# Patient Record
Sex: Female | Born: 1985 | Race: White | Hispanic: No | Marital: Married | Smoking: Current every day smoker
Health system: Southern US, Community
[De-identification: ages and names within clinical notes are randomized; demographics above are authoritative.]

## PROBLEM LIST (undated history)

## (undated) ENCOUNTER — Inpatient Hospital Stay (HOSPITAL_COMMUNITY): Payer: Self-pay

## (undated) DIAGNOSIS — O26839 Pregnancy related renal disease, unspecified trimester: Secondary | ICD-10-CM

## (undated) DIAGNOSIS — F909 Attention-deficit hyperactivity disorder, unspecified type: Secondary | ICD-10-CM

## (undated) DIAGNOSIS — F419 Anxiety disorder, unspecified: Secondary | ICD-10-CM

## (undated) DIAGNOSIS — N390 Urinary tract infection, site not specified: Secondary | ICD-10-CM

## (undated) DIAGNOSIS — F329 Major depressive disorder, single episode, unspecified: Secondary | ICD-10-CM

## (undated) DIAGNOSIS — F32A Depression, unspecified: Secondary | ICD-10-CM

## (undated) DIAGNOSIS — N200 Calculus of kidney: Secondary | ICD-10-CM

## (undated) HISTORY — DX: Major depressive disorder, single episode, unspecified: F32.9

## (undated) HISTORY — DX: Urinary tract infection, site not specified: N39.0

## (undated) HISTORY — DX: Attention-deficit hyperactivity disorder, unspecified type: F90.9

## (undated) HISTORY — DX: Pregnancy related renal disease, unspecified trimester: O26.839

## (undated) HISTORY — DX: Calculus of kidney: N20.0

## (undated) HISTORY — DX: Depression, unspecified: F32.A

## (undated) HISTORY — PX: BREAST BIOPSY: SHX20

## (undated) HISTORY — DX: Anxiety disorder, unspecified: F41.9

---

## 2009-05-17 ENCOUNTER — Encounter: Payer: Self-pay | Admitting: Internal Medicine

## 2009-06-12 ENCOUNTER — Encounter: Payer: Self-pay | Admitting: Internal Medicine

## 2009-06-15 ENCOUNTER — Encounter: Payer: Self-pay | Admitting: Internal Medicine

## 2009-10-02 ENCOUNTER — Ambulatory Visit: Payer: Self-pay | Admitting: Internal Medicine

## 2009-10-02 DIAGNOSIS — E282 Polycystic ovarian syndrome: Secondary | ICD-10-CM | POA: Insufficient documentation

## 2009-10-02 DIAGNOSIS — F329 Major depressive disorder, single episode, unspecified: Secondary | ICD-10-CM

## 2009-10-02 DIAGNOSIS — N390 Urinary tract infection, site not specified: Secondary | ICD-10-CM

## 2009-10-02 DIAGNOSIS — F32A Depression, unspecified: Secondary | ICD-10-CM | POA: Insufficient documentation

## 2009-10-02 DIAGNOSIS — F988 Other specified behavioral and emotional disorders with onset usually occurring in childhood and adolescence: Secondary | ICD-10-CM

## 2009-10-23 ENCOUNTER — Ambulatory Visit (HOSPITAL_COMMUNITY): Admission: RE | Admit: 2009-10-23 | Discharge: 2009-10-23 | Payer: Self-pay | Admitting: Internal Medicine

## 2009-10-31 ENCOUNTER — Telehealth: Payer: Self-pay | Admitting: Internal Medicine

## 2009-11-16 ENCOUNTER — Ambulatory Visit: Payer: Self-pay | Admitting: Internal Medicine

## 2010-05-17 ENCOUNTER — Ambulatory Visit: Payer: Self-pay | Admitting: Internal Medicine

## 2010-05-17 DIAGNOSIS — L719 Rosacea, unspecified: Secondary | ICD-10-CM | POA: Insufficient documentation

## 2010-06-18 ENCOUNTER — Telehealth: Payer: Self-pay | Admitting: Internal Medicine

## 2010-06-20 ENCOUNTER — Encounter: Payer: Self-pay | Admitting: Internal Medicine

## 2010-07-26 ENCOUNTER — Ambulatory Visit
Admission: RE | Admit: 2010-07-26 | Discharge: 2010-07-26 | Payer: Self-pay | Source: Home / Self Care | Attending: Internal Medicine | Admitting: Internal Medicine

## 2010-08-06 NOTE — Assessment & Plan Note (Signed)
Summary: NEW TO [facility] PER DR//CCM   Vital Signs:  Patient profile:   25 year old female Height:      69.5 inches Weight:      222 pounds BMI:     32.43 Temp:     98.4 degrees F oral Pulse rate:   80 / minute Resp:     14 per minute BP sitting:   130 / 80  (left arm)  Vitals Entered By: Willy Eddy, LPN (October 02, 2009 2:52 PM) CC: new pt to establish   CC:  new pt to establish.  History of Present Illness: 52 you WF  with regular menses and flow who present with weight issues and ache and has seen an endocrinologist The work up was for adrenal and ovarian issues and the workup was non revealing CT and Labs breast cysts with aspiration and then removal reviewd all labs I have spent greater that 30 min face to face evaluating this patient   Preventive Screening-Counseling & Management  Alcohol-Tobacco     Smoking Status: current     Packs/Day: 0.5     Year Started: 2005  Caffeine-Diet-Exercise     Does Patient Exercise: no      Drug Use:  no.    Problems Prior to Update: None  Medications Prior to Update: 1)  None  Current Medications (verified): 1)  Adderall Xr 30 Mg Xr24h-Cap (Amphetamine-Dextroamphetamine) .Marland Kitchen.. 1 Once Daily 2)  Sertraline Hcl 100 Mg Tabs (Sertraline Hcl) .Marland Kitchen.. 1 Once Daily 3)  Septra Ds 800-160 Mg Tabs (Sulfamethoxazole-Trimethoprim) .Marland Kitchen.. 1 Two Times A Day (Derm Ordered) 4)  Metformin Hcl 500 Mg Xr24h-Tab (Metformin Hcl) .... One By Mouth Daily  Allergies (verified): 1)  ! Ceclor 2)  ! * Pediazole 3)  ! Augmentin (Amoxicillin-Pot Clavulanate)  Past History:  Family History: Last updated: 10/02/2009 Family History High cholesterol  Social History: Last updated: 10/02/2009 Occupation:part time student and Company secretary Single Current Smoker Alcohol use-yes Drug use-no Regular exercise-no  Risk Factors: Exercise: no (10/02/2009)  Risk Factors: Smoking Status: current (10/02/2009) Packs/Day: 0.5 (10/02/2009)  Past  medical, surgical, family and social histories (including risk factors) reviewed for relevance to current acute and chronic problems.  Past Medical History: Depression neonatal jaundice uti  Past Surgical History: breast bc 2010  Family History: Reviewed history and no changes required. Family History High cholesterol  Social History: Reviewed history and no changes required. Occupation:part time Theatre stage manager Single Current Smoker Alcohol use-yes Drug use-no Regular exercise-no Smoking Status:  current Packs/Day:  0.5 Drug Use:  no Does Patient Exercise:  no  Review of Systems  The patient denies anorexia, fever, weight loss, weight gain, vision loss, decreased hearing, hoarseness, chest pain, syncope, dyspnea on exertion, peripheral edema, prolonged cough, headaches, hemoptysis, abdominal pain, melena, hematochezia, severe indigestion/heartburn, hematuria, incontinence, genital sores, muscle weakness, suspicious skin lesions, transient blindness, difficulty walking, depression, unusual weight change, abnormal bleeding, enlarged lymph nodes, angioedema, and breast masses.    Physical Exam  General:  Well-developed,well-nourished,in no acute distress; alert,appropriate and cooperative throughout examination Head:  Normocephalic and atraumatic without obvious abnormalities. No apparent alopecia or balding. Eyes:  No corneal or conjunctival inflammation noted. EOMI. Perrla. Funduscopic exam benign, without hemorrhages, exudates or papilledema. Vision grossly normal. Ears:  R ear normal and L ear normal.   Nose:  no external deformity and no nasal discharge.   Mouth:  good dentition and pharynx pink and moist.   Lungs:  normal respiratory effort and  no wheezes.   Heart:  normal rate and regular rhythm.   Abdomen:  soft, non-tender, and normal bowel sounds.   Skin:  no ecchymoses, no petechiae, no purpura, and acneiform lesions.   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 1 once daily  Discussed treatment options, including trial of antidpressant medication. Will refer to behavioral health. Follow-up call in in 24-48 hours and recheck in 2 weeks, sooner as needed. Patient agrees to call if any worsening of symptoms or thoughts of doing harm arise. Verified that the patient has no suicidal ideation at this time.   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 1 once daily 2)  Sertraline Hcl 100 Mg Tabs (Sertraline hcl) .Marland Kitchen.. 1 once daily 3)  Septra Ds 800-160 Mg Tabs (Sulfamethoxazole-trimethoprim) .Marland Kitchen.. 1 two times a day (derm ordered) 4)  Metformin Hcl 500 Mg Xr24h-tab (Metformin hcl) .... One by mouth daily  Patient Instructions: 1)  Please schedule a follow-up appointment in 2 months. Prescriptions: METFORMIN HCL 500 MG XR24H-TAB (METFORMIN HCL) one by mouth daily  #30 x 3   Entered and Authorized by:   Stacie Glaze MD   Signed by:   Stacie Glaze MD on 10/02/2009   Method used:   Electronically to        [facility] # [phone]* (retail)       [address]       [place], [place]  [address]       Ph: [phone]       Fax: [phone]   RxID:   [id number]    Preventive Care Screening  Pap Smear:    Date:   02/03/2009    Next Due:  02/2010    Results:  normal   Last Tetanus Booster:    Date:  08/07/2008    Results:  Td

## 2010-08-06 NOTE — Progress Notes (Signed)
Summary: lab  Phone Note Call from Patient Call back at [phone]   Reason for Call: Acute Illness Summary of Call: Results of labs done at [facility]. Initial call taken by: Rudy Jew, RN,  October 31, 2009 8:28 AM  Follow-up for Phone Call        left message on machine wnl and no polycystic ovairies Follow-up by: Willy Eddy, LPN,  October 31, 2009 9:53 AM

## 2010-08-06 NOTE — Letter (Signed)
Summary: [facility]   Imported By: Maryln Gottron 10/05/2009 09:09:19  _____________________________________________________________________  External Attachment:    Type:   Image     Comment:   External Document

## 2010-08-06 NOTE — Assessment & Plan Note (Signed)
Summary: 2 month rov/njr   Vital Signs:  Patient profile:   25 year old female Height:      69.5 inches Weight:      222 pounds BMI:     32.43 Temp:     98.2 degrees F oral Pulse rate:   76 / minute Resp:     14 per minute BP sitting:   110 / 70  (left arm)  Vitals Entered By: Willy Eddy, LPN (Nov 16, 2009 3:45 PM) CC: roa   CC:  roa.  History of Present Illness: the metformin had helped the length and the cramps of the menstraul cycle  Preventive Screening-Counseling & Management  Alcohol-Tobacco     Smoking Status: current     Packs/Day: 0.5     Year Started: 2005  Problems Prior to Update: 1)  Attention Deficit Disorder, Adult  (ICD-314.00) 2)  Stein-leventhal Syndrome  (ICD-256.4) 3)  Uti  (ICD-599.0) 4)  Depression  (ICD-311)  Current Problems (verified): 1)  Attention Deficit Disorder, Adult  (ICD-314.00) 2)  Stein-leventhal Syndrome  (ICD-256.4) 3)  Uti  (ICD-599.0) 4)  Depression  (ICD-311)  Medications Prior to Update: 1)  Adderall Xr 30 Mg Xr24h-Cap (Amphetamine-Dextroamphetamine) .Marland Kitchen.. 1 Once Daily 2)  Sertraline Hcl 100 Mg Tabs (Sertraline Hcl) .Marland Kitchen.. 1 Once Daily 3)  Septra Ds 800-160 Mg Tabs (Sulfamethoxazole-Trimethoprim) .Marland Kitchen.. 1 Two Times A Day (Derm Ordered) 4)  Metformin Hcl 500 Mg Xr24h-Tab (Metformin Hcl) .... One By Mouth Daily  Current Medications (verified): 1)  Adderall Xr 30 Mg Xr24h-Cap (Amphetamine-Dextroamphetamine) .Marland Kitchen.. 1 Once Daily 2)  Sertraline Hcl 100 Mg Tabs (Sertraline Hcl) .Marland Kitchen.. 1 Once Daily 3)  Septra Ds 800-160 Mg Tabs (Sulfamethoxazole-Trimethoprim) .Marland Kitchen.. 1 Two Times A Day (Derm Ordered) 4)  Metformin Hcl 750 Mg Xr24h-Tab (Metformin Hcl) .... One By Mouth Daily  Allergies (verified): 1)  ! Ceclor 2)  ! * Pediazole 3)  ! Augmentin (Amoxicillin-Pot Clavulanate)  Past History:  Family History: Last updated: 10/02/2009 Family History High cholesterol  Social History: Last updated: 10/02/2009 Occupation:part time  student and Company secretary Single Current Smoker Alcohol use-yes Drug use-no Regular exercise-no  Risk Factors: Exercise: no (10/02/2009)  Risk Factors: Smoking Status: current (11/16/2009) Packs/Day: 0.5 (11/16/2009)  Past medical, surgical, family and social histories (including risk factors) reviewed, and no changes noted (except as noted below).  Past Medical History: Reviewed history from 10/02/2009 and no changes required. Depression neonatal jaundice uti  Past Surgical History: Reviewed history from 10/02/2009 and no changes required. breast bc 2010  Family History: Reviewed history from 10/02/2009 and no changes required. Family History High cholesterol  Social History: Reviewed history from 10/02/2009 and no changes required. Occupation:part time Theatre stage manager Single Current Smoker Alcohol use-yes Drug use-no Regular exercise-no  Review of Systems  The patient denies anorexia, fever, weight loss, weight gain, vision loss, decreased hearing, hoarseness, chest pain, syncope, dyspnea on exertion, peripheral edema, prolonged cough, headaches, hemoptysis, abdominal pain, melena, hematochezia, severe indigestion/heartburn, hematuria, incontinence, genital sores, muscle weakness, suspicious skin lesions, transient blindness, difficulty walking, depression, unusual weight change, abnormal bleeding, enlarged lymph nodes, angioedema, and breast masses.    Physical Exam  General:  Well-developed,well-nourished,in no acute distress; alert,appropriate and cooperative throughout examination Head:  Normocephalic and atraumatic without obvious abnormalities. No apparent alopecia or balding. Eyes:  pupils equal and pupils round.   Ears:  R ear normal and L ear normal.   Lungs:  normal respiratory effort and no wheezes.   Heart:  normal rate and regular rhythm.   Abdomen:  soft, non-tender, and normal bowel sounds.     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 1 once daily  Discussed treatment options, including trial of antidpressant medication. Will refer to behavioral health. Follow-up call in in 24-48 hours and recheck in 2 weeks, sooner as needed. Patient agrees to call if any worsening of symptoms or thoughts of doing harm arise. Verified that the patient has no suicidal ideation at this time.   Complete Medication List: 1)  Adderall Xr 30 Mg Xr24h-cap (Amphetamine-dextroamphetamine) .Marland Kitchen.. 1 once daily 2)  Sertraline Hcl 100 Mg Tabs (Sertraline hcl) .Marland Kitchen.. 1 once daily 3)  Septra Ds 800-160 Mg Tabs (Sulfamethoxazole-trimethoprim) .Marland Kitchen.. 1 two times a day (derm ordered) 4)  Metformin Hcl 750 Mg Xr24h-tab (Metformin hcl) .... One by mouth daily  Patient Instructions: 1)  Please schedule a follow-up appointment in 6 months. Prescriptions: METFORMIN HCL 750 MG XR24H-TAB (METFORMIN HCL) one by mouth daily  #30 x 11   Entered and Authorized by:   Stacie Glaze MD   Signed by:   Stacie Glaze MD on 11/16/2009   Method used:   Electronically to        [facility] [address] (retail)       [address]       [place], [place]  [address]       Ph: [phone]       Fax: [phone]   RxID:   [id number]

## 2010-08-06 NOTE — Assessment & Plan Note (Signed)
Summary: 6 MTH ROV // RS   Vital Signs:  Patient profile:   25 year old female Height:      69.5 inches Weight:      232 pounds BMI:     33.89 Temp:     98.3 degrees F oral Pulse rate:   76 / minute Resp:     14 per minute BP sitting:   120 / 80  (left arm)  Vitals Entered By: Willy Eddy, LPN (May 17, 2010 2:56 PM) CC: ROA-ADDERALL CHANGED TO VYVANSE AND WORKS LONGER Is Patient Diabetic? Yes   Primary Care Provider:  Stacie Glaze MD  CC:  ROA-ADDERALL CHANGED TO VYVANSE AND WORKS LONGER.  History of Present Illness: weigth is up she is on vyance 60 mg feels well on that and she like the long lasting effect writen by psych ( also on the sertaline 100 mg) diet: no specific diet  has been on the metformin for PCO Has been of the septra DS for over a year had been on topical in the past ( drying skin) was on doxycyclin bu go a skin reactin ( rash ) to it Was on Duact and retin A   Preventive Screening-Counseling & Management  Alcohol-Tobacco     Smoking Status: current     Packs/Day: 0.5     Year Started: 2005  Problems Prior to Update: 1)  Acne Rosacea  (ICD-695.3) 2)  Attention Deficit Disorder, Adult  (ICD-314.00) 3)  Stein-leventhal Syndrome  (ICD-256.4) 4)  Uti  (ICD-599.0) 5)  Depression  (ICD-311)  Current Problems (verified): 1)  Attention Deficit Disorder, Adult  (ICD-314.00) 2)  Stein-leventhal Syndrome  (ICD-256.4) 3)  Uti  (ICD-599.0) 4)  Depression  (ICD-311)  Medications Prior to Update: 1)  Adderall Xr 30 Mg Xr24h-Cap (Amphetamine-Dextroamphetamine) .Marland Kitchen.. 1 Once Daily 2)  Sertraline Hcl 100 Mg Tabs (Sertraline Hcl) .Marland Kitchen.. 1 Once Daily 3)  Septra Ds 800-160 Mg Tabs (Sulfamethoxazole-Trimethoprim) .Marland Kitchen.. 1 Two Times A Day (Derm Ordered) 4)  Metformin Hcl 750 Mg Xr24h-Tab (Metformin Hcl) .... One By Mouth Daily  Current Medications (verified): 1)  Vyvanse 60 Mg Caps (Lisdexamfetamine Dimesylate) .Marland Kitchen.. 1 Once Daily 2)  Sertraline Hcl 100  Mg Tabs (Sertraline Hcl) .Marland Kitchen.. 1 Once Daily 3)  Septra Ds 800-160 Mg Tabs (Sulfamethoxazole-Trimethoprim) .Marland Kitchen.. 1 Two Times A Day (Derm Ordered) 4)  Metformin Hcl 500 Mg Xr24h-Tab (Metformin Hcl) .... Two By Mouth Once A Day 5)  Benzoyl Peroxide Cleanser 6 % Lotn (Benzoyl Peroxide) .... Wash Face in Am With Cleanser 6)  Vitazol 0.75 % Crea (Metronidazole) .... Apply To Face Two Times A Day  Allergies (verified): 1)  ! Ceclor 2)  ! * Pediazole 3)  ! Augmentin (Amoxicillin-Pot Clavulanate)  Past History:  Family History: Last updated: 10/02/2009 Family History High cholesterol  Social History: Last updated: 10/02/2009 Occupation:part time student and Company secretary Single Current Smoker Alcohol use-yes Drug use-no Regular exercise-no  Risk Factors: Exercise: no (10/02/2009)  Risk Factors: Smoking Status: current (05/17/2010) Packs/Day: 0.5 (05/17/2010)  Past medical, surgical, family and social histories (including risk factors) reviewed, and no changes noted (except as noted below).  Past Medical History: Reviewed history from 10/02/2009 and no changes required. Depression neonatal jaundice uti  Past Surgical History: Reviewed history from 10/02/2009 and no changes required. breast bc 2010  Family History: Reviewed history from 10/02/2009 and no changes required. Family History High cholesterol  Social History: Reviewed history from 10/02/2009 and no changes required. Occupation:part time student and  Company secretary Single Current Smoker Alcohol use-yes Drug use-no Regular exercise-no  Review of Systems  The patient denies anorexia, fever, weight loss, weight gain, vision loss, decreased hearing, hoarseness, chest pain, syncope, dyspnea on exertion, peripheral edema, prolonged cough, headaches, hemoptysis, abdominal pain, melena, hematochezia, severe indigestion/heartburn, hematuria, incontinence, genital sores, muscle weakness, suspicious skin lesions,  transient blindness, difficulty walking, depression, unusual weight change, abnormal bleeding, enlarged lymph nodes, angioedema, and breast masses.    Physical Exam  General:  Well-developed,well-nourished,in no acute distress; alert,appropriate and cooperative throughout examination Head:  Normocephalic and atraumatic without obvious abnormalities. No apparent alopecia or balding. Eyes:  pupils equal and pupils round.   Neck:  No deformities, masses, or tenderness noted. Lungs:  normal respiratory effort and no wheezes.   Heart:  normal rate and regular rhythm.   Abdomen:  soft, non-tender, and normal bowel sounds.   Skin:  turgor normal, color normal, and acneiform lesions.   Cervical Nodes:  No lymphadenopathy noted Psych:  Cognition and judgment appear intact. Alert and cooperative with normal attention span and concentration. 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 1 once daily 2)  Sertraline Hcl 100 Mg Tabs (Sertraline hcl) .Marland Kitchen.. 1 once daily 3)  Septra Ds 800-160 Mg Tabs (Sulfamethoxazole-trimethoprim) .Marland Kitchen.. 1 two times a day (derm ordered) 4)  Metformin Hcl 500 Mg Xr24h-tab (Metformin hcl) .... Two by mouth once a day 5)  Benzoyl Peroxide Cleanser 6 % Lotn (Benzoyl peroxide) .... Wash face in am with cleanser 6)  Vitazol 0.75 % Crea (Metronidazole) .... Apply to face two times a day  Patient Instructions: 1)  [email or web address] 2)  Please schedule a follow-up appointment in 2 months. Prescriptions: METFORMIN HCL 500 MG XR24H-TAB (METFORMIN HCL) two by mouth once a day  #60 x 4    Entered and Authorized by:   Stacie Glaze MD   Signed by:   Stacie Glaze MD on 05/17/2010   Method used:   Electronically to        [facility] # [id number]* (retail)       [address]       [place], [place]  [address]       Ph: [phone]       Fax: [phone]   RxID:   [id number] VITAZOL 0.75 % CREA (METRONIDAZOLE) apply to face two times a day  #30 d supply x 4   Entered and Authorized by:   Stacie Glaze MD   Signed by:   Stacie Glaze MD on 05/17/2010   Method used:   Electronically to        [facility] # [id number]* (retail)       [address]       [place], [place]  [address]       Ph: [phone]       Fax: [phone]   RxID:   [id number] BENZOYL PEROXIDE CLEANSER 6 % LOTN (BENZOYL PEROXIDE) wash face in AM with cleanser  #1 bottle x 2   Entered and Authorized by:   Stacie Glaze MD   Signed by:   Stacie Glaze MD  on 05/17/2010   Method used:   Electronically to        [facility] # [address]* (retail)       [address]       [place], [place]  [address]       Ph: [phone]       Fax: [phone]   RxID:   [id number]    Orders Added: 1)  Est. Patient Level IV [[address]]

## 2010-08-08 NOTE — Progress Notes (Signed)
Summary: [facility] called re: Oscion Cleanser not available. 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  This is unavailble by manufacture, req alternative. Pls call [facility] at [phone] option 2  ref# [phone] Initial call taken by: Lucy Antigua,  June 18, 2010 4:20 PM    New/Updated Medications: PR BENZOYL PEROXIDE WASH 7 % LIQD (BENZOYL PEROXIDE) wash face q am Prescriptions: PR BENZOYL PEROXIDE WASH 7 % LIQD (BENZOYL PEROXIDE) wash face q am  #473 ml x 0   Entered by:   Willy Eddy, LPN   Authorized by:   Stacie Glaze MD   Signed by:   Willy Eddy, LPN on 41/32/4401   Method used:   Telephoned to ...       [facility] [address]* (retail)       [address]       [place], [place]  [address]       Ph: [phone]       Fax: [phone]   RxID:   [id number]   Appended Document: [facility] called re: Oscion Cleanser not available. Need alternative faxed to [facility]

## 2010-08-08 NOTE — Medication Information (Signed)
Summary: Confirmation of Rx for PR Benzoyl Peroxide Wash  Confirmation of Rx for PR Benzoyl Peroxide Wash   Imported By: Maryln Gottron 07/25/2010 09:10:39  _____________________________________________________________________  External Attachment:    Type:   Image     Comment:   External Document

## 2010-08-22 NOTE — Assessment & Plan Note (Signed)
Summary: 2 month rov/njr   Vital Signs:  Patient profile:   25 year old female Height:      69.5 inches Weight:      224 pounds BMI:     32.72 Temp:     98.2 degrees F oral Pulse rate:   72 / minute Resp:     14 per minute BP sitting:   136 / 80  (left arm)  Vitals Entered By: Willy Eddy, LPN (July 26, 2010 3:34 PM)  Primary Care Provider:  Stacie Glaze MD   History of Present Illness: has not seen an improvement in the acne. the was helps but the topical is not helping. 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 1 Once Daily 2)  Sertraline Hcl 100 Mg Tabs (Sertraline Hcl) .Marland Kitchen.. 1 Once Daily 3)  Septra Ds 800-160 Mg Tabs (Sulfamethoxazole-Trimethoprim) .Marland Kitchen.. 1 Two Times A Day (Derm Ordered) 4)  Metformin Hcl 500 Mg Xr24h-Tab (Metformin Hcl) .... Two By Mouth Once A Day 5)  Pr Benzoyl Peroxide Wash 7 % Liqd (Benzoyl Peroxide) .... Wash Face Q Am 6)  Vitazol 0.75 % Crea (Metronidazole) .... Apply To Face Two Times A Day  Current Medications (verified): 1)  Vyvanse 60 Mg Caps (Lisdexamfetamine Dimesylate) .Marland Kitchen.. 1 Once Daily 2)  Sertraline Hcl 100 Mg Tabs (Sertraline Hcl) .Marland Kitchen.. 1 Once Daily 3)  Metformin Hcl 500 Mg Xr24h-Tab (Metformin Hcl) .... Two By Mouth Once A Day 4)  Pr Benzoyl Peroxide Wash 7 % Liqd (Benzoyl Peroxide) .... Wash Face Q Am 5)  Benzoyl Peroxide-Erythromycin 5-3 % Gel (Benzoyl Peroxide-Erythromycin) .... Apply To Face Two Times A Day As Needed  Allergies (verified): 1)  ! Ceclor 2)  ! * Pediazole 3)  ! Augmentin (Amoxicillin-Pot Clavulanate)  Past History:  Family History: Last  updated: 10/02/2009 Family History High cholesterol  Social History: Last updated: 10/02/2009 Occupation:part time student and Company secretary Single Current Smoker Alcohol use-yes Drug use-no Regular exercise-no  Risk Factors: Exercise: no (10/02/2009)  Risk Factors: Smoking Status: never (07/26/2010) Packs/Day: 0.5 (05/17/2010)  Past medical, surgical, family and social histories (including risk factors) reviewed, and no changes noted (except as noted below).  Past Medical History: Reviewed history from 10/02/2009 and no changes required. Depression neonatal jaundice uti  Past Surgical History: Reviewed history from 10/02/2009 and no changes required. breast bc 2010  Family History: Reviewed history from 10/02/2009 and no changes required. Family History High cholesterol  Social History: Reviewed history from 10/02/2009 and no changes required. Occupation:part time Theatre stage manager Single Current Smoker Alcohol use-yes Drug use-no Regular exercise-no Smoking Status:  never  Review of Systems  The patient denies anorexia, fever, weight loss, weight gain, vision loss, decreased hearing, hoarseness, chest pain, syncope, dyspnea on exertion, peripheral edema, prolonged cough, headaches, hemoptysis, abdominal pain, melena, hematochezia, severe indigestion/heartburn, hematuria, incontinence, genital sores, muscle weakness, suspicious skin lesions, transient blindness, difficulty walking, depression, unusual weight change, abnormal bleeding, enlarged lymph nodes, angioedema, and breast masses.    Physical Exam  General:  Well-developed,well-nourished,in no acute distress; alert,appropriate and cooperative throughout examination Head:  Normocephalic and atraumatic without obvious abnormalities. No  apparent alopecia or balding. Eyes:  pupils equal and pupils round.   Ears:  R ear normal and L ear normal.   Nose:  no external deformity and no nasal  discharge.   Mouth:  good dentition and pharynx pink and moist.   Neck:  No deformities, masses, or tenderness noted. Lungs:  normal respiratory effort and no wheezes.   Heart:  normal rate and regular rhythm.     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 1 once daily 2)  Sertraline Hcl 100 Mg Tabs (Sertraline hcl) .Marland Kitchen.. 1 once daily 3)  Metformin Hcl 500 Mg Xr24h-tab (Metformin hcl) .... Two by mouth once a day 4)  Pr Benzoyl Peroxide Wash 7 % Liqd (Benzoyl peroxide) .... Wash face q am 5)  Benzoyl Peroxide-erythromycin 5-3 % Gel (Benzoyl peroxide-erythromycin) .... Apply to face two times a day as needed  Patient Instructions: 1)  Please schedule a follow-up appointment in 3 months. Prescriptions: VYVANSE 60 MG CAPS (LISDEXAMFETAMINE DIMESYLATE) 1 once daily  #30 x 0   Entered and Authorized by:   Stacie Glaze MD   Signed by:   Stacie Glaze MD on 07/26/2010   Method used:   Print then Give to Patient   RxID:   [id number] VYVANSE 60 MG CAPS (LISDEXAMFETAMINE DIMESYLATE) 1 once daily  #30 x 0   Entered and Authorized by:   Stacie Glaze MD   Signed by:   Stacie Glaze MD on 07/26/2010   Method used:   Print then Give to Patient   RxID:   [id number] VYVANSE 60 MG CAPS (LISDEXAMFETAMINE DIMESYLATE) 1 once daily  #30 x 0   Entered and Authorized by:   Stacie Glaze MD   Signed by:   Stacie Glaze MD on 07/26/2010   Method used:   Print then Give to Patient   RxID:   [id number] BENZOYL PEROXIDE-ERYTHROMYCIN 5-3 % GEL (BENZOYL PEROXIDE-ERYTHROMYCIN) apply to face two times a day as needed  #60gm x 3   Entered and Authorized by:    Stacie Glaze MD   Signed by:   Stacie Glaze MD on 07/26/2010   Method used:   Electronically to        [facility] # [phone]* (retail)       [address]       [place], [place]  [address]       Ph: [phone]       Fax: [phone]   RxID:   [id number]    Orders Added: 1)  Est. Patient Level III [93810]

## 2010-10-24 ENCOUNTER — Encounter: Payer: Self-pay | Admitting: Internal Medicine

## 2010-11-01 ENCOUNTER — Ambulatory Visit: Payer: Self-pay | Admitting: Internal Medicine

## 2010-11-05 ENCOUNTER — Encounter: Payer: Self-pay | Admitting: Internal Medicine

## 2010-11-05 ENCOUNTER — Ambulatory Visit (INDEPENDENT_AMBULATORY_CARE_PROVIDER_SITE_OTHER): Payer: BC Managed Care – PPO | Admitting: Internal Medicine

## 2010-11-05 VITALS — BP 120/80 | HR 72 | Temp 98.2°F | Resp 14 | Ht 70.0 in | Wt 220.0 lb

## 2010-11-05 DIAGNOSIS — E282 Polycystic ovarian syndrome: Secondary | ICD-10-CM

## 2010-11-05 NOTE — Patient Instructions (Signed)
Polycystic ovaries require more frequent smaller meals. Recommend 6 small meals a day and that should help with your sensation of sleepiness and fatigue

## 2010-11-05 NOTE — Progress Notes (Signed)
Subjective:    Patient ID: Sara Reid, female    DOB: 10/28/1985, 25 y.o.   MRN: [id number]  HPI patient is a 25 year old female patient who is followed for polycystic ovaries  [name] She has noticed less pain with her menstrual cycles and more regular menstrual cycles.  Her acne it remains stable although not worsened but she has not been compliant with the use of erythromycin cream. She has been taking her metformin twice a day her weight is stable and she has had some episodic hypoglycemia this.     Review of Systems  Constitutional: Negative for activity change, appetite change and fatigue.  HENT: Negative for ear pain, congestion, neck pain, postnasal drip and sinus pressure.   Eyes: Negative for redness and visual disturbance.  Respiratory: Negative for cough, shortness of breath and wheezing.   Gastrointestinal: Negative for abdominal pain and abdominal distention.  Genitourinary: Negative for dysuria, frequency and menstrual problem.  Musculoskeletal: Negative for myalgias, joint swelling and arthralgias.  Skin: Negative for rash and wound.  Neurological: Negative for dizziness, weakness and headaches.  Hematological: Negative for adenopathy. Does not bruise/bleed easily.  Psychiatric/Behavioral: Negative for sleep disturbance and decreased concentration.   Past Medical History  Diagnosis Date  . Depression   . Neonatal jaundice   . UTI (lower urinary tract infection)    Past Surgical History  Procedure Date  . Breast biopsy     reports that she has been smoking.  She does not have any smokeless tobacco history on file. She reports that she drinks alcohol. She reports that she does not use illicit drugs. family history includes Hyperlipidemia in her mother. Allergies  Allergen Reactions  . Cefaclor     REACTION: rash  . WUJ:WJXBJYNWGNF+AOZHYQMVH+QIONGEXBMW Acid+Aspartame     REACTION: rash  . Pediazole     REACTION: rash       Objective:   Physical Exam  Constitutional: She is oriented to person, place, and time. She appears well-developed and well-nourished. No distress.  HENT:  Head: Normocephalic and atraumatic.  Right Ear: External ear normal.  Left Ear: External ear normal.  Nose: Nose normal.  Mouth/Throat: Oropharynx is clear and moist.  Eyes: Conjunctivae and EOM are normal. Pupils are equal, round, and reactive to light.  Neck: Normal range of motion. Neck supple. No JVD present. No tracheal deviation present. No thyromegaly present.  Cardiovascular: Normal rate, regular rhythm, normal heart sounds and intact distal pulses.   No murmur heard. Pulmonary/Chest: Effort normal and breath sounds normal. She has no wheezes. She exhibits no tenderness.  Abdominal: Soft. Bowel sounds are normal.  Musculoskeletal: Normal range of motion. She exhibits no edema and no tenderness.  Lymphadenopathy:    She has no cervical adenopathy.  Neurological: She is alert and oriented to person, place, and time. She has normal reflexes. No cranial nerve deficit.  Skin: Skin is warm and dry. She is not diaphoretic.  Psychiatric: She has a normal mood and affect. Her behavior is normal.          Assessment & Plan:  Discussed diet associated with polycystic ovarian disease and hypoglycemia recommend 6 small or protein containing meals daily rather than 3 squares.  She should continue her metformin her hemoglobin A1c and a liver function will be obtained today.  Weight loss and exercise are recommended with a gold weight reduction of 20 pounds before next visit. She is referred to dermatologist for further evaluation of her acne. She is considering laser therapy  recommend discontinuation of all antibiotics at least 3 months prior to laser skin therapy to skin sensitivity

## 2011-01-20 ENCOUNTER — Telehealth: Payer: Self-pay | Admitting: *Deleted

## 2011-01-20 DIAGNOSIS — E282 Polycystic ovarian syndrome: Secondary | ICD-10-CM

## 2011-01-20 NOTE — Telephone Encounter (Signed)
Referral to [facility] for polycystic ovarian cystic syndrome-per dr Lovell Sheehan this is ok

## 2011-05-09 ENCOUNTER — Ambulatory Visit: Payer: BC Managed Care – PPO | Admitting: Internal Medicine

## 2011-05-26 ENCOUNTER — Encounter: Payer: Self-pay | Admitting: Internal Medicine

## 2011-05-26 ENCOUNTER — Ambulatory Visit (INDEPENDENT_AMBULATORY_CARE_PROVIDER_SITE_OTHER): Payer: BC Managed Care – PPO | Admitting: Internal Medicine

## 2011-05-26 VITALS — BP 124/80 | HR 80 | Temp 98.4°F | Resp 16 | Ht 70.0 in | Wt 227.0 lb

## 2011-05-26 DIAGNOSIS — F909 Attention-deficit hyperactivity disorder, unspecified type: Secondary | ICD-10-CM

## 2011-05-26 DIAGNOSIS — E282 Polycystic ovarian syndrome: Secondary | ICD-10-CM

## 2011-05-26 DIAGNOSIS — L709 Acne, unspecified: Secondary | ICD-10-CM

## 2011-05-26 DIAGNOSIS — L708 Other acne: Secondary | ICD-10-CM

## 2011-05-26 MED ORDER — BENZOYL PEROXIDE-ERYTHROMYCIN 5-3 % EX GEL: Freq: Two times a day (BID) | CUTANEOUS | Status: DC

## 2011-05-26 MED ORDER — AMPHETAMINE-DEXTROAMPHET ER 30 MG PO CP24: 30.0000 mg | ORAL_CAPSULE | ORAL | Status: AC

## 2011-05-26 MED ORDER — METFORMIN HCL 500 MG PO TABS: 500.0000 mg | ORAL_TABLET | Freq: Two times a day (BID) | ORAL | Status: DC

## 2011-05-26 NOTE — Patient Instructions (Signed)
The patient is instructed to continue all medications as prescribed. Schedule followup with check out clerk upon leaving the clinic  

## 2011-05-26 NOTE — Progress Notes (Signed)
  Subjective:    Patient ID: Sara Reid, female    DOB: [birth year], 25 y.o.   MRN: [id number]  HPI Ankle sprain Medication review Psychiatrist prefers long acting medivcations She is on the vyvance for ADD glucophage for PCO menstrual patter improved wit medications    Review of Systems  Constitutional: Negative for activity change, appetite change and fatigue.  HENT: Negative for ear pain, congestion, neck pain, postnasal drip and sinus pressure.   Eyes: Negative for redness and visual disturbance.  Respiratory: Negative for cough, shortness of breath and wheezing.   Gastrointestinal: Negative for abdominal pain and abdominal distention.  Genitourinary: Negative for dysuria, frequency and menstrual problem.  Musculoskeletal: Negative for myalgias, joint swelling and arthralgias.  Skin: Negative for rash and wound.  Neurological: Negative for dizziness, weakness and headaches.  Hematological: Negative for adenopathy. Does not bruise/bleed easily.  Psychiatric/Behavioral: Negative for sleep disturbance and decreased concentration.       Objective:   Physical Exam  Nursing note and vitals reviewed. Constitutional: She is oriented to person, place, and time. She appears well-developed and well-nourished. No distress.  HENT:  Head: Normocephalic and atraumatic.  Right Ear: External ear normal.  Left Ear: External ear normal.  Nose: Nose normal.  Mouth/Throat: Oropharynx is clear and moist.  Eyes: Conjunctivae and EOM are normal. Pupils are equal, round, and reactive to light.  Neck: Normal range of motion. Neck supple. No JVD present. No tracheal deviation present. No thyromegaly present.  Cardiovascular: Normal rate, regular rhythm, normal heart sounds and intact distal pulses.   No murmur heard. Pulmonary/Chest: Effort normal and breath sounds normal. She has no wheezes. She exhibits no tenderness.  Abdominal: Soft. Bowel sounds are normal.  Musculoskeletal: Normal  range of motion. She exhibits no edema and no tenderness.  Lymphadenopathy:    She has no cervical adenopathy.  Neurological: She is alert and oriented to person, place, and time. She has normal reflexes. No cranial nerve deficit.  Skin: Skin is warm and dry. She is not diaphoretic.  Psychiatric: She has a normal mood and affect. Her behavior is normal.          Assessment & Plan:  Stable ADD on current medications.   Leventhal syndrome on metformin with food menstrual cycles and that her facial acne.  Mild to moderate facial acne using topical cleanser and topical antibiotics

## 2011-08-05 ENCOUNTER — Other Ambulatory Visit (INDEPENDENT_AMBULATORY_CARE_PROVIDER_SITE_OTHER): Payer: BC Managed Care – PPO

## 2011-08-05 DIAGNOSIS — Z0000 Encounter for general adult medical examination without abnormal findings: Secondary | ICD-10-CM

## 2011-08-05 LAB — HEPATIC FUNCTION PANEL
ALT: 18 U/L (ref 0–35)
AST: 16 U/L (ref 0–37)
Alkaline Phosphatase: 50 U/L (ref 39–117)
Bilirubin, Direct: 0.1 mg/dL (ref 0.0–0.3)
Total Bilirubin: 0.6 mg/dL (ref 0.3–1.2)
Total Protein: 7 g/dL (ref 6.0–8.3)

## 2011-08-05 LAB — LIPID PANEL
Cholesterol: 140 mg/dL (ref 0–200)
HDL: 41.7 mg/dL (ref >39.00)
LDL Cholesterol: 86 mg/dL (ref 0–99)

## 2011-08-05 LAB — BASIC METABOLIC PANEL
GFR: 104.45 mL/min (ref >60.00)
Glucose, Bld: 96 mg/dL (ref 70–99)
Sodium: 141 mEq/L (ref 135–145)

## 2011-08-05 LAB — POCT URINALYSIS DIPSTICK
Blood, UA: NEGATIVE
Ketones, UA: NEGATIVE
Leukocytes, UA: NEGATIVE
Nitrite, UA: NEGATIVE
Spec Grav, UA: 1.025

## 2011-08-05 LAB — CBC WITH DIFFERENTIAL/PLATELET
Basophils Absolute: 0 10*3/uL (ref 0.0–0.1)
Basophils Relative: 0.3 % (ref 0.0–3.0)
Eosinophils Absolute: 0.1 10*3/uL (ref 0.0–0.7)
Eosinophils Relative: 2.3 % (ref 0.0–5.0)
Hemoglobin: 13.4 g/dL (ref 12.0–15.0)
MCV: 93.5 fl (ref 78.0–100.0)
Neutrophils Relative %: 65.5 % (ref 43.0–77.0)
Platelets: 271 10*3/uL (ref 150.0–400.0)
RBC: 4.14 Mil/uL (ref 3.87–5.11)
RDW: 12.4 % (ref 11.5–14.6)

## 2011-08-18 ENCOUNTER — Encounter: Payer: BC Managed Care – PPO | Admitting: Internal Medicine

## 2011-08-19 ENCOUNTER — Ambulatory Visit (INDEPENDENT_AMBULATORY_CARE_PROVIDER_SITE_OTHER): Payer: BC Managed Care – PPO | Admitting: Internal Medicine

## 2011-08-19 ENCOUNTER — Encounter: Payer: Self-pay | Admitting: Internal Medicine

## 2011-08-19 DIAGNOSIS — L905 Scar conditions and fibrosis of skin: Secondary | ICD-10-CM

## 2011-08-19 DIAGNOSIS — L700 Acne vulgaris: Secondary | ICD-10-CM

## 2011-08-19 DIAGNOSIS — F909 Attention-deficit hyperactivity disorder, unspecified type: Secondary | ICD-10-CM

## 2011-08-19 DIAGNOSIS — Z0000 Encounter for general adult medical examination without abnormal findings: Secondary | ICD-10-CM

## 2011-08-19 DIAGNOSIS — L708 Other acne: Secondary | ICD-10-CM

## 2011-08-19 MED ORDER — TRETINOIN 0.05 % EX CREA: TOPICAL_CREAM | Freq: Every day | CUTANEOUS | Status: DC

## 2011-08-19 MED ORDER — CLINDAMYCIN PHOS-BENZOYL PEROX 1.2-5 % EX GEL: 1.0000 "application " | Freq: Two times a day (BID) | CUTANEOUS | Status: DC

## 2011-08-19 MED ORDER — LISDEXAMFETAMINE DIMESYLATE 70 MG PO CAPS: 70.0000 mg | ORAL_CAPSULE | ORAL | Status: DC

## 2011-08-19 NOTE — Progress Notes (Signed)
  Subjective:    Patient ID: Sara Reid, female    DOB: Jun 27, 1986, 25 y.o.   MRN: [id number]  HPI Patient is a 26 year old female who presents for a complete physical examination.  Her comorbid findings today including a history of polycystic ovarian disease attention deficit disorder and acne vulgaris She also requires refill of her ADD drugs she is tolerating them well and is doing well on these medications.  She is also taking metformin for this  Stein-Leventhal syndrome.     Review of Systems  Constitutional: Negative for activity change, appetite change and fatigue.  HENT: Negative for ear pain, congestion, neck pain, postnasal drip and sinus pressure.   Eyes: Negative for redness and visual disturbance.  Respiratory: Negative for cough, shortness of breath and wheezing.   Gastrointestinal: Negative for abdominal pain and abdominal distention.  Genitourinary: Negative for dysuria, frequency and menstrual problem.  Musculoskeletal: Negative for myalgias, joint swelling and arthralgias.  Skin: Negative for rash and wound.  Neurological: Negative for dizziness, weakness and headaches.  Hematological: Negative for adenopathy. Does not bruise/bleed easily.  Psychiatric/Behavioral: Negative for sleep disturbance and decreased concentration.       Objective:   Physical Exam  Nursing note and vitals reviewed. Constitutional: She is oriented to person, place, and time. She appears well-developed and well-nourished. No distress.  HENT:  Head: Normocephalic and atraumatic.  Right Ear: External ear normal.  Left Ear: External ear normal.  Nose: Nose normal.  Mouth/Throat: Oropharynx is clear and moist.  Eyes: Conjunctivae and EOM are normal. Pupils are equal, round, and reactive to light.  Neck: Normal range of motion. Neck supple. No JVD present. No tracheal deviation present. No thyromegaly present.  Cardiovascular: Normal rate, regular rhythm, normal heart sounds and  intact distal pulses.   No murmur heard. Pulmonary/Chest: Effort normal and breath sounds normal. She has no wheezes. She exhibits no tenderness.  Abdominal: Soft. Bowel sounds are normal.  Musculoskeletal: Normal range of motion. She exhibits no edema and no tenderness.  Lymphadenopathy:    She has no cervical adenopathy.  Neurological: She is alert and oriented to person, place, and time. She has normal reflexes. No cranial nerve deficit.  Skin: Skin is warm and dry. She is not diaphoretic.       Acne   Psychiatric: She has a normal mood and affect. Her behavior is normal.          Assessment & Plan:   This is a routine physical examination for this healthy  Female. Reviewed all health maintenance protocols including mammography colonoscopy bone density and reviewed appropriate screening labs. Her immunization history was reviewed as well as her current medications and allergies refills of her chronic medications were given and the plan for yearly health maintenance was discussed all orders and referrals were made as appropriate.   90 day refill of her ADD medicines.  We filled both topical gel and lotion for face.  She is using an antibiotic gel Duac And a 0.5% retinoic acid cream.

## 2011-08-19 NOTE — Patient Instructions (Signed)
The patient is instructed to continue all medications as prescribed. Schedule followup with check out clerk upon leaving the clinic  

## 2011-08-29 ENCOUNTER — Ambulatory Visit: Payer: BC Managed Care – PPO | Admitting: Internal Medicine

## 2011-11-21 ENCOUNTER — Other Ambulatory Visit: Payer: Self-pay | Admitting: *Deleted

## 2011-11-21 ENCOUNTER — Ambulatory Visit (INDEPENDENT_AMBULATORY_CARE_PROVIDER_SITE_OTHER): Payer: BC Managed Care – PPO | Admitting: Internal Medicine

## 2011-11-21 ENCOUNTER — Ambulatory Visit: Payer: BC Managed Care – PPO | Admitting: Internal Medicine

## 2011-11-21 ENCOUNTER — Encounter: Payer: Self-pay | Admitting: Internal Medicine

## 2011-11-21 VITALS — BP 140/80 | HR 76 | Temp 98.2°F | Resp 16 | Ht 70.0 in | Wt 220.0 lb

## 2011-11-21 DIAGNOSIS — F909 Attention-deficit hyperactivity disorder, unspecified type: Secondary | ICD-10-CM

## 2011-11-21 DIAGNOSIS — T887XXA Unspecified adverse effect of drug or medicament, initial encounter: Secondary | ICD-10-CM

## 2011-11-21 MED ORDER — LISDEXAMFETAMINE DIMESYLATE 70 MG PO CAPS: 70.0000 mg | ORAL_CAPSULE | ORAL | Status: DC

## 2011-11-21 NOTE — Progress Notes (Signed)
  Subjective:    Patient ID: Sara Reid, female    DOB: October 23, 1985, 26 y.o.   MRN: [id number]  HPI Follow up for ADD medications the patient has done well on the medication she is working a full-time job she states her concentration is excellent.  She also takes that for mild to moderate depression with fatigue and the ADD medications has helped her with her lack of energy and fatigue.  She is also followed for Stein-Leventhal syndrome and is doing well on metformin.  Her weight is stable   Review of Systems  Constitutional: Negative for activity change, appetite change and fatigue.  HENT: Negative for ear pain, congestion, neck pain, postnasal drip and sinus pressure.   Eyes: Negative for redness and visual disturbance.  Respiratory: Negative for cough, shortness of breath and wheezing.   Gastrointestinal: Negative for abdominal pain and abdominal distention.  Genitourinary: Negative for dysuria, frequency and menstrual problem.  Musculoskeletal: Negative for myalgias, joint swelling and arthralgias.  Skin: Negative for rash and wound.  Neurological: Negative for dizziness, weakness and headaches.  Hematological: Negative for adenopathy. Does not bruise/bleed easily.  Psychiatric/Behavioral: Negative for sleep disturbance and decreased concentration.       Objective:   Physical Exam  Nursing note and vitals reviewed. Constitutional: She is oriented to person, place, and time. She appears well-developed and well-nourished. No distress.  HENT:  Head: Normocephalic and atraumatic.  Right Ear: External ear normal.  Left Ear: External ear normal.  Nose: Nose normal.  Mouth/Throat: Oropharynx is clear and moist.  Eyes: Conjunctivae and EOM are normal. Pupils are equal, round, and reactive to light.  Neck: Normal range of motion. Neck supple. No JVD present. No tracheal deviation present. No thyromegaly present.  Cardiovascular: Normal rate, regular rhythm, normal heart  sounds and intact distal pulses.   No murmur heard. Pulmonary/Chest: Effort normal and breath sounds normal. She has no wheezes. She exhibits no tenderness.  Abdominal: Soft. Bowel sounds are normal.  Musculoskeletal: Normal range of motion. She exhibits no edema and no tenderness.  Lymphadenopathy:    She has no cervical adenopathy.  Neurological: She is alert and oriented to person, place, and time. She has normal reflexes. No cranial nerve deficit.  Skin: Skin is warm and dry. She is not diaphoretic.  Psychiatric: She has a normal mood and affect. Her behavior is normal.          Assessment & Plan:  Stable weight with Stein-Leventhal syndrome.  The ADD effectively treated with medications stable but refills needed for 90 days.  Refills given to patient discussed compliance with medication discussed monitoring for side effects

## 2012-01-27 ENCOUNTER — Other Ambulatory Visit: Payer: Self-pay | Admitting: Internal Medicine

## 2012-02-27 ENCOUNTER — Ambulatory Visit (INDEPENDENT_AMBULATORY_CARE_PROVIDER_SITE_OTHER): Payer: BC Managed Care – PPO | Admitting: Internal Medicine

## 2012-02-27 ENCOUNTER — Encounter: Payer: Self-pay | Admitting: Internal Medicine

## 2012-02-27 ENCOUNTER — Other Ambulatory Visit: Payer: Self-pay | Admitting: *Deleted

## 2012-02-27 VITALS — BP 130/74 | HR 76 | Temp 98.2°F | Resp 16 | Ht 70.0 in | Wt 226.0 lb

## 2012-02-27 DIAGNOSIS — F988 Other specified behavioral and emotional disorders with onset usually occurring in childhood and adolescence: Secondary | ICD-10-CM

## 2012-02-27 DIAGNOSIS — L719 Rosacea, unspecified: Secondary | ICD-10-CM

## 2012-02-27 DIAGNOSIS — E282 Polycystic ovarian syndrome: Secondary | ICD-10-CM

## 2012-02-27 MED ORDER — SERTRALINE HCL 100 MG PO TABS: 100.0000 mg | ORAL_TABLET | Freq: Every day | ORAL | Status: DC

## 2012-02-27 MED ORDER — LISDEXAMFETAMINE DIMESYLATE 70 MG PO CAPS: 70.0000 mg | ORAL_CAPSULE | ORAL | Status: DC

## 2012-02-27 MED ORDER — METFORMIN HCL 500 MG PO TABS: 500.0000 mg | ORAL_TABLET | Freq: Two times a day (BID) | ORAL | Status: DC

## 2012-02-27 NOTE — Progress Notes (Signed)
  Subjective:    Patient ID: Sara Reid, female    DOB: May 19, 1986, 26 y.o.   MRN: [id number]  HPI Patient is a 26 year old female who presents for followup of ADD per ADD protocol.  She also has a history of Stein-Leventhal. She's been stable on her current medications she is having a refill of her medications per protocol   Review of Systems  Constitutional: Negative for activity change, appetite change and fatigue.  HENT: Negative for ear pain, congestion, neck pain, postnasal drip and sinus pressure.   Eyes: Negative for redness and visual disturbance.  Respiratory: Negative for cough, shortness of breath and wheezing.   Gastrointestinal: Negative for abdominal pain and abdominal distention.  Genitourinary: Negative for dysuria, frequency and menstrual problem.  Musculoskeletal: Negative for myalgias, joint swelling and arthralgias.  Skin: Negative for rash and wound.  Neurological: Negative for dizziness, weakness and headaches.  Hematological: Negative for adenopathy. Does not bruise/bleed easily.  Psychiatric/Behavioral: Negative for disturbed wake/sleep cycle and decreased concentration.       Objective:   Physical Exam  Constitutional: She is oriented to person, place, and time. She appears well-developed and well-nourished. No distress.  HENT:  Head: Normocephalic and atraumatic.  Right Ear: External ear normal.  Left Ear: External ear normal.  Nose: Nose normal.  Mouth/Throat: Oropharynx is clear and moist.  Eyes: Conjunctivae and EOM are normal. Pupils are equal, round, and reactive to light.  Neck: Normal range of motion. Neck supple. No JVD present. No tracheal deviation present. No thyromegaly present.  Cardiovascular: Normal rate, regular rhythm, normal heart sounds and intact distal pulses.   No murmur heard. Pulmonary/Chest: Effort normal and breath sounds normal. She has no wheezes. She exhibits no tenderness.  Abdominal: Soft. Bowel sounds are normal.    Musculoskeletal: Normal range of motion. She exhibits no edema and no tenderness.  Lymphadenopathy:    She has no cervical adenopathy.  Neurological: She is alert and oriented to person, place, and time. She has normal reflexes. No cranial nerve deficit.  Skin: Skin is warm and dry. She is not diaphoretic.  Psychiatric: She has a normal mood and affect. Her behavior is normal.          Assessment & Plan:  ADD refill of medications per protocol 9 date of medications given per protocol with 3 prescriptions

## 2012-05-28 ENCOUNTER — Ambulatory Visit (INDEPENDENT_AMBULATORY_CARE_PROVIDER_SITE_OTHER): Payer: BC Managed Care – PPO | Admitting: Internal Medicine

## 2012-05-28 ENCOUNTER — Encounter: Payer: Self-pay | Admitting: Internal Medicine

## 2012-05-28 VITALS — BP 136/80 | HR 72 | Temp 98.2°F | Resp 16 | Ht 70.0 in | Wt 226.0 lb

## 2012-05-28 DIAGNOSIS — F988 Other specified behavioral and emotional disorders with onset usually occurring in childhood and adolescence: Secondary | ICD-10-CM

## 2012-05-28 DIAGNOSIS — M545 Low back pain: Secondary | ICD-10-CM

## 2012-05-28 DIAGNOSIS — F329 Major depressive disorder, single episode, unspecified: Secondary | ICD-10-CM

## 2012-05-28 DIAGNOSIS — M25571 Pain in right ankle and joints of right foot: Secondary | ICD-10-CM

## 2012-05-28 MED ORDER — LISDEXAMFETAMINE DIMESYLATE 70 MG PO CAPS: 70.0000 mg | ORAL_CAPSULE | ORAL | Status: DC

## 2012-05-28 MED ORDER — SERTRALINE HCL 100 MG PO TABS: 100.0000 mg | ORAL_TABLET | Freq: Every day | ORAL | Status: DC

## 2012-05-28 NOTE — Patient Instructions (Addendum)
Go to [place] for xrays

## 2012-06-01 ENCOUNTER — Ambulatory Visit (HOSPITAL_BASED_OUTPATIENT_CLINIC_OR_DEPARTMENT_OTHER)
Admission: RE | Admit: 2012-06-01 | Discharge: 2012-06-01 | Disposition: A | Discharge status: Home / Self Care | Payer: BC Managed Care – PPO | Source: Ambulatory Visit | Attending: Internal Medicine | Admitting: Internal Medicine

## 2012-06-01 DIAGNOSIS — IMO0002 Reserved for concepts with insufficient information to code with codable children: Secondary | ICD-10-CM | POA: Insufficient documentation

## 2012-06-01 DIAGNOSIS — M545 Low back pain, unspecified: Secondary | ICD-10-CM | POA: Insufficient documentation

## 2012-06-01 DIAGNOSIS — M25572 Pain in left ankle and joints of left foot: Secondary | ICD-10-CM

## 2012-06-01 DIAGNOSIS — M533 Sacrococcygeal disorders, not elsewhere classified: Secondary | ICD-10-CM | POA: Insufficient documentation

## 2012-06-01 DIAGNOSIS — M25579 Pain in unspecified ankle and joints of unspecified foot: Secondary | ICD-10-CM | POA: Insufficient documentation

## 2012-07-18 NOTE — Progress Notes (Signed)
  Subjective:    Patient ID: Sara Reid, female    DOB: 03/18/1986, 27 y.o.   MRN: [id number]  HPI  reviewed and refill of ADD medications per protocol No side effects noted Still has positive effect on functioning  Review of Systems  Constitutional: Negative for activity change, appetite change and fatigue.  HENT: Negative for ear pain, congestion, neck pain, postnasal drip and sinus pressure.   Eyes: Negative for redness and visual disturbance.  Respiratory: Negative for cough, shortness of breath and wheezing.   Gastrointestinal: Negative for abdominal pain and abdominal distention.  Genitourinary: Negative for dysuria, frequency and menstrual problem.  Musculoskeletal: Negative for myalgias, joint swelling and arthralgias.  Skin: Negative for rash and wound.  Neurological: Negative for dizziness, weakness and headaches.  Hematological: Negative for adenopathy. Does not bruise/bleed easily.  Psychiatric/Behavioral: Negative for sleep disturbance and decreased concentration.       Objective:   Physical Exam  Nursing note and vitals reviewed. Constitutional: She is oriented to person, place, and time. She appears well-developed and well-nourished. No distress.  HENT:  Head: Normocephalic and atraumatic.  Right Ear: External ear normal.  Left Ear: External ear normal.  Nose: Nose normal.  Mouth/Throat: Oropharynx is clear and moist.  Eyes: Conjunctivae normal and EOM are normal. Pupils are equal, round, and reactive to light.  Neck: Normal range of motion. Neck supple. No JVD present. No tracheal deviation present. No thyromegaly present.  Cardiovascular: Normal rate, regular rhythm, normal heart sounds and intact distal pulses.   No murmur heard. Pulmonary/Chest: Effort normal and breath sounds normal. She has no wheezes. She exhibits no tenderness.  Abdominal: Soft. Bowel sounds are normal.  Musculoskeletal: Normal range of motion. She exhibits no edema and no  tenderness.  Lymphadenopathy:    She has no cervical adenopathy.  Neurological: She is alert and oriented to person, place, and time. She has normal reflexes. No cranial nerve deficit.  Skin: Skin is warm and dry. She is not diaphoretic.  Psychiatric: She has a normal mood and affect. Her behavior is normal.          Assessment & Plan:  Stable ADD Refill per protocol reviewed rosasea and acne medications

## 2012-09-10 ENCOUNTER — Ambulatory Visit: Payer: BC Managed Care – PPO | Admitting: Internal Medicine

## 2012-09-14 ENCOUNTER — Telehealth: Payer: Self-pay | Admitting: Internal Medicine

## 2012-09-14 ENCOUNTER — Other Ambulatory Visit: Payer: Self-pay | Admitting: Internal Medicine

## 2012-09-14 MED ORDER — LISDEXAMFETAMINE DIMESYLATE 70 MG PO CAPS: 70.0000 mg | ORAL_CAPSULE | ORAL | Status: DC

## 2012-09-14 NOTE — Telephone Encounter (Signed)
Printed and will cal pt when dr Lovell Sheehan

## 2012-09-14 NOTE — Telephone Encounter (Signed)
Patient called stating that she needed refill of her Vyvanse 70mg  1poqd. Please assist.

## 2012-09-17 ENCOUNTER — Other Ambulatory Visit (INDEPENDENT_AMBULATORY_CARE_PROVIDER_SITE_OTHER): Payer: BC Managed Care – PPO

## 2012-09-17 DIAGNOSIS — Z0000 Encounter for general adult medical examination without abnormal findings: Secondary | ICD-10-CM

## 2012-09-17 LAB — LIPID PANEL
LDL Cholesterol: 105 mg/dL — ABNORMAL HIGH (ref 0–99)
Total CHOL/HDL Ratio: 4
VLDL: 12.4 mg/dL (ref 0.0–40.0)

## 2012-09-17 LAB — HEPATIC FUNCTION PANEL
ALT: 25 U/L (ref 0–35)
AST: 20 U/L (ref 0–37)
Albumin: 4.2 g/dL (ref 3.5–5.2)
Total Bilirubin: 0.6 mg/dL (ref 0.3–1.2)

## 2012-09-17 LAB — CBC WITH DIFFERENTIAL/PLATELET
Basophils Relative: 0.2 % (ref 0.0–3.0)
Eosinophils Relative: 1.6 % (ref 0.0–5.0)
HCT: 41.9 % (ref 36.0–46.0)
Lymphocytes Relative: 19.5 % (ref 12.0–46.0)
MCV: 91.1 fl (ref 78.0–100.0)
Monocytes Relative: 7.6 % (ref 3.0–12.0)
Neutro Abs: 6.2 10*3/uL (ref 1.4–7.7)
Platelets: 303 10*3/uL (ref 150.0–400.0)
RDW: 12.9 % (ref 11.5–14.6)

## 2012-09-17 LAB — POCT URINALYSIS DIPSTICK
Blood, UA: NEGATIVE
Glucose, UA: NEGATIVE
Nitrite, UA: NEGATIVE
Protein, UA: NEGATIVE
Spec Grav, UA: 1.025
Urobilinogen, UA: 0.2

## 2012-09-17 LAB — BASIC METABOLIC PANEL
BUN: 15 mg/dL (ref 6–23)
CO2: 24 mEq/L (ref 19–32)
Chloride: 107 mEq/L (ref 96–112)
Creatinine, Ser: 0.8 mg/dL (ref 0.4–1.2)
Glucose, Bld: 106 mg/dL — ABNORMAL HIGH (ref 70–99)
Potassium: 4.2 mEq/L (ref 3.5–5.1)

## 2012-09-17 LAB — TSH: TSH: 0.7 u[IU]/mL (ref 0.35–5.50)

## 2012-09-22 ENCOUNTER — Encounter: Payer: Self-pay | Admitting: Family

## 2012-09-22 ENCOUNTER — Ambulatory Visit (INDEPENDENT_AMBULATORY_CARE_PROVIDER_SITE_OTHER): Payer: BC Managed Care – PPO | Admitting: Family

## 2012-09-22 VITALS — BP 120/90 | HR 91 | Ht 69.5 in | Wt 227.0 lb

## 2012-09-22 DIAGNOSIS — Z0000 Encounter for general adult medical examination without abnormal findings: Secondary | ICD-10-CM

## 2012-09-22 NOTE — Patient Instructions (Signed)
Exercise to Stay Healthy Exercise helps you become and stay healthy. EXERCISE IDEAS AND TIPS Choose exercises that:  You enjoy.  Fit into your day. You do not need to exercise really hard to be healthy. You can do exercises at a slow or medium level and stay healthy. You can:  Stretch before and after working out.  Try yoga, Pilates, or tai chi.  Lift weights.  Walk fast, swim, jog, run, climb stairs, bicycle, dance, or rollerskate.  Take aerobic classes. Exercises that burn about 150 calories:  Running 1  miles in 15 minutes.  Playing volleyball for 45 to 60 minutes.  Washing and waxing a car for 45 to 60 minutes.  Playing touch football for 45 minutes.  Walking 1  miles in 35 minutes.  Pushing a stroller 1  miles in 30 minutes.  Playing basketball for 30 minutes.  Raking leaves for 30 minutes.  Bicycling 5 miles in 30 minutes.  Walking 2 miles in 30 minutes.  Dancing for 30 minutes.  Shoveling snow for 15 minutes.  Swimming laps for 20 minutes.  Walking up stairs for 15 minutes.  Bicycling 4 miles in 15 minutes.  Gardening for 30 to 45 minutes.  Jumping rope for 15 minutes.  Washing windows or floors for 45 to 60 minutes. Document Released: 07/26/2010 Document Revised: 09/15/2011 Document Reviewed: 07/26/2010 [employer] Patient Information 2013 [employer].  

## 2012-09-23 ENCOUNTER — Encounter: Payer: Self-pay | Admitting: Family

## 2012-09-23 NOTE — Progress Notes (Signed)
Subjective:    Patient ID: Sara Reid, female    DOB: [birth year], 27 y.o.   MRN: [id number]  HPI This is a routine physical examination for this healthy  Female. Reviewed all health maintenance protocols including mammography colonoscopy bone density and reviewed appropriate screening labs. Her immunization history was reviewed as well as her current medications and allergies refills of her chronic medications were given and the plan for yearly health maintenance was discussed all orders and referrals were made as appropriate.   Review of Systems  Constitutional: Negative.   HENT: Negative.   Eyes: Negative.   Respiratory: Negative.   Cardiovascular: Negative.   Gastrointestinal: Negative.   Endocrine: Negative.   Genitourinary: Negative.   Musculoskeletal: Negative.   Skin: Negative.   Allergic/Immunologic: Negative.   Neurological: Negative.   Hematological: Negative.   Psychiatric/Behavioral: Negative.    Past Medical History  Diagnosis Date  . Depression   . Neonatal jaundice   . UTI (lower urinary tract infection)     History   Social History  . Marital Status: Single    Spouse Name: N/A    Number of Children: N/A  . 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 Smoking status: Current Every Day Smoker -- 1.00 packs/day  . Smokeless tobacco: Not on file  . Alcohol Use: Yes  . Drug Use: No  . Sexually Active: Yes   Other Topics Concern  . Not on file   Social History Narrative  . No narrative on file    Past Surgical History  Procedure Laterality Date  . Breast biopsy      Family History  Problem Relation Age of Onset  . Hyperlipidemia Mother     Allergies  Allergen Reactions  . Amoxicillin-Pot Clavulanate     REACTION: rash  . Cefaclor     REACTION: rash  . Eryzole     REACTION: rash    Current Outpatient Prescriptions on File Prior to Visit  Medication  Sig Dispense Refill  . lisdexamfetamine (VYVANSE) 70 MG capsule Take 1 capsule (70 mg total) by mouth every morning.  30 capsule  0  . metFORMIN (GLUCOPHAGE) 500 MG tablet Take 1 tablet (500 mg total) by mouth 2 (two) times daily with a meal.  60 tablet  6  . metFORMIN (GLUCOPHAGE) 500 MG tablet TAKE 1 TABLET BY MOUTH TWICE DAILY WITH A MEAL  60 tablet  0  . sertraline (ZOLOFT) 100 MG tablet Take 1 tablet (100 mg total) by mouth daily.  30 tablet  6   No current facility-administered medications on file prior to visit.    BP 120/90  Pulse 91  Ht 5' 9.5" (1.765 m)  Wt 227 lb (102.967 kg)  BMI 33.05 kg/m2  SpO2 98%chart    Objective:   Physical Exam  Constitutional: She is oriented to person, place, and time. She appears well-developed and well-nourished.  HENT:  Right Ear: External ear normal.  Left Ear: External ear normal.  Nose: Nose normal.  Mouth/Throat: Oropharynx is clear and moist.  Eyes: Conjunctivae are normal.  Neck: Normal range of motion. Neck supple.  Cardiovascular: Normal rate, regular rhythm and normal heart sounds.   Pulmonary/Chest: Effort normal and breath sounds normal.  Abdominal: Soft. Bowel sounds are normal.  Musculoskeletal: Normal range of motion.  Neurological: She is alert and oriented to person, place, and time. She has normal reflexes.  Skin: Skin is warm and dry.  Psychiatric: She has a normal mood and affect.          Assessment & Plan:  Assessment: 1. CPX 2. Hypercholesterolemia  Plan: Encouraged a healthy diet, exercise, self breast exams, weight reduction. Recheck in 1 year and sooner as needed.

## 2012-10-13 ENCOUNTER — Other Ambulatory Visit: Payer: Self-pay | Admitting: Internal Medicine

## 2012-10-20 ENCOUNTER — Other Ambulatory Visit: Payer: Self-pay | Admitting: *Deleted

## 2012-10-20 MED ORDER — METFORMIN HCL 500 MG PO TABS: 500.0000 mg | ORAL_TABLET | Freq: Two times a day (BID) | ORAL | Status: DC

## 2012-11-05 ENCOUNTER — Other Ambulatory Visit: Payer: BC Managed Care – PPO

## 2012-11-12 ENCOUNTER — Encounter: Payer: BC Managed Care – PPO | Admitting: Internal Medicine

## 2012-12-23 ENCOUNTER — Telehealth: Payer: Self-pay | Admitting: Internal Medicine

## 2012-12-23 MED ORDER — LISDEXAMFETAMINE DIMESYLATE 70 MG PO CAPS: 70.0000 mg | ORAL_CAPSULE | ORAL | Status: DC

## 2012-12-23 NOTE — Telephone Encounter (Signed)
Printed and will call pt when dr Lovell Sheehan signs tomorrow

## 2012-12-23 NOTE — Telephone Encounter (Signed)
Pt would like refill of lisdexamfetamine (VYVANSE) 70 MG capsule. Pt would 3 scripts (90 day supply)

## 2012-12-29 ENCOUNTER — Other Ambulatory Visit: Payer: Self-pay | Admitting: *Deleted

## 2012-12-29 MED ORDER — SERTRALINE HCL 100 MG PO TABS: 100.0000 mg | ORAL_TABLET | Freq: Every day | ORAL | Status: DC

## 2013-01-24 ENCOUNTER — Other Ambulatory Visit: Payer: Self-pay | Admitting: Internal Medicine

## 2013-03-24 ENCOUNTER — Telehealth: Payer: Self-pay | Admitting: Internal Medicine

## 2013-03-24 ENCOUNTER — Encounter: Payer: Self-pay | Admitting: Family Medicine

## 2013-03-24 ENCOUNTER — Ambulatory Visit (INDEPENDENT_AMBULATORY_CARE_PROVIDER_SITE_OTHER): Payer: BC Managed Care – PPO | Admitting: Family Medicine

## 2013-03-24 VITALS — BP 124/78 | Temp 98.4°F | Wt 223.0 lb

## 2013-03-24 DIAGNOSIS — F17200 Nicotine dependence, unspecified, uncomplicated: Secondary | ICD-10-CM

## 2013-03-24 DIAGNOSIS — J329 Chronic sinusitis, unspecified: Secondary | ICD-10-CM

## 2013-03-24 MED ORDER — DOXYCYCLINE HYCLATE 100 MG PO CAPS: 100.0000 mg | ORAL_CAPSULE | Freq: Two times a day (BID) | ORAL | Status: DC

## 2013-03-24 MED ORDER — LISDEXAMFETAMINE DIMESYLATE 70 MG PO CAPS: 70.0000 mg | ORAL_CAPSULE | ORAL | Status: DC

## 2013-03-24 NOTE — Patient Instructions (Addendum)
INSTRUCTIONS FOR UPPER RESPIRATORY INFECTION:  -plenty of rest and fluids  -As we discussed, we have prescribed a new medication for you at this appointment. We discussed the common and serious potential adverse effects of this medication and you can review these and more with the pharmacist when you pick up your medication.  Please follow the instructions for use carefully and notify us immediately if you have any problems taking this medication.  -nasal saline wash 2-3 times daily (use prepackaged nasal saline or bottled/distilled water if making your own)   -can use sinex or afrin nasal spray for drainage and nasal congestion - but do NOT use longer then 3-4 days  -can use tylenol or ibuprofen as directed for aches and sorethroat  -in the winter time, using a humidifier at night is helpful (please follow cleaning instructions)  -if you are taking a cough medication - use only as directed, may also try a teaspoon of honey to coat the throat and throat lozenges  -for sore throat, salt water gargles can help  -follow up if you have fevers, facial pain, tooth pain, difficulty breathing or are worsening or not getting better in 5-7 days  Quit smoking

## 2013-03-24 NOTE — Telephone Encounter (Signed)
Ready for pick up at 4pm ov

## 2013-03-24 NOTE — Telephone Encounter (Addendum)
Pt request refill of lisdexamfetamine (VYVANSE) 70 MG capsule Pt is being seen for sinus inf today at 4pm and would like to pick up script if that is OK?

## 2013-03-24 NOTE — Progress Notes (Signed)
Chief Complaint  Patient presents with  . Sinusitis    HPI:  Acute visit for sinus congestion: -started about 2 weeks ago and has not gotten better -symptoms: nasal congestion, sinus pressure and pain, drainage, cough, R ear fullness and pain -denies: fevers, NVD, SOB -tried OTC meds and allergy medications -hx of sinus infection remotely -reports can take amoxicillin, but not Augmentin -FDLMP: 2 weeks ago, not pregnant  Tobacco Abuse: -she wants to try to quit own -weaning down -goal to be done with cigarettes in 30 days for her wedding  ROS: See pertinent positives and negatives per HPI.  Past Medical History  Diagnosis Date  . Depression   . Neonatal jaundice   . UTI (lower urinary tract infection)     Past Surgical History  Procedure Laterality Date  . Breast biopsy      Family History  Problem Relation Age of Onset  . Hyperlipidemia Mother     History   Social History  . Marital Status: Single    Spouse Name: N/A    Number of Children: N/A  . 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 Smoking status: Current Every Day Smoker -- 1.00 packs/day  . Smokeless tobacco: None  . Alcohol Use: Yes  . Drug Use: No  . Sexual Activity: Yes   Other Topics Concern  . None   Social History Narrative  . None    Current outpatient prescriptions:lisdexamfetamine (VYVANSE) 70 MG capsule, Take 1 capsule (70 mg total) by mouth every morning., Disp: 30 capsule, Rfl: 0;  metFORMIN (GLUCOPHAGE) 500 MG tablet, TAKE 1 TABLET BY MOUTH TWICE DAILY WITH FOOD, Disp: 60 tablet, Rfl: 0;  metFORMIN (GLUCOPHAGE) 500 MG tablet, Take 1 tablet (500 mg total) by mouth 2 (two) times daily with a meal., Disp: 180 tablet, Rfl: 3 sertraline (ZOLOFT) 100 MG tablet, Take 1 tablet (100 mg total) by mouth daily., Disp: 30 tablet, Rfl: 6;  sertraline (ZOLOFT) 100 MG tablet, TAKE 1 TABLET BY MOUTH EVERY DAY, Disp:  30 tablet, Rfl: 6;  doxycycline (VIBRAMYCIN) 100 MG capsule, Take 1 capsule (100 mg total) by mouth 2 (two) times daily., Disp: 20 capsule, Rfl: 0  EXAM:  Filed Vitals:   03/24/13 1608  BP: 124/78  Temp: 98.4 F (36.9 C)    Body mass index is 32.47 kg/(m^2).  GENERAL: vitals reviewed and listed above, alert, oriented, appears well hydrated and in no acute distress  HEENT: atraumatic, conjunttiva clear, no obvious abnormalities on inspection of external nose and ears, normal appearance of ear canals and TMs, white nasal congestion, mild post oropharyngeal erythema with PND, no tonsillar edema or exudate, no sinus TTP  NECK: no obvious masses on inspection  LUNGS: clear to auscultation bilaterally, no wheezes, rales or rhonchi, good air movement  CV: HRRR, no peripheral edema  MS: moves all extremities without noticeable abnormality  PSYCH: pleasant and cooperative, no obvious depression or anxiety  ASSESSMENT AND PLAN:  Discussed the following assessment and plan:  Sinusitis - Plan: doxycycline (VIBRAMYCIN) 100 MG capsule  Tobacco use disorder  -Recommendations per orders and instructions, risks and use of medications and return precautions discussed.d -Patient advised to return or notify a doctor immediately if symptoms worsen or persist or new concerns arise.  Patient Instructions  INSTRUCTIONS FOR UPPER RESPIRATORY INFECTION:  -plenty of rest and fluids  -As we discussed, we have prescribed a new medication  for you at this appointment. We discussed the common and serious potential adverse effects of this medication and you can review these and more with the pharmacist when you pick up your medication.  Please follow the instructions for use carefully and notify us immediately if you have any problems taking this medication.  -nasal saline wash 2-3 times daily (use prepackaged nasal saline or bottled/distilled water if making your own)   -can use sinex or afrin nasal  spray for drainage and nasal congestion - but do NOT use longer then 3-4 days  -can use tylenol or ibuprofen as directed for aches and sorethroat  -in the winter time, using a humidifier at night is helpful (please follow cleaning instructions)  -if you are taking a cough medication - use only as directed, may also try a teaspoon of honey to coat the throat and throat lozenges  -for sore throat, salt water gargles can help  -follow up if you have fevers, facial pain, tooth pain, difficulty breathing or are worsening or not getting better in 5-7 days  Quit smoking      Samaiya Awadallah R.

## 2013-04-08 ENCOUNTER — Ambulatory Visit (INDEPENDENT_AMBULATORY_CARE_PROVIDER_SITE_OTHER): Payer: BC Managed Care – PPO | Admitting: Internal Medicine

## 2013-04-08 ENCOUNTER — Encounter: Payer: Self-pay | Admitting: Internal Medicine

## 2013-04-08 VITALS — BP 106/66 | HR 75 | Temp 98.5°F | Wt 226.0 lb

## 2013-04-08 DIAGNOSIS — L282 Other prurigo: Secondary | ICD-10-CM

## 2013-04-08 MED ORDER — PREDNISONE 20 MG PO TABS: ORAL_TABLET | ORAL | Status: DC

## 2013-04-08 NOTE — Patient Instructions (Signed)
Uncertain cause  But seems to be related to photosensitivity and doxy  Stop the doxycycline .   For now and cover up No uv exposures  Should fade but if doesn't can take the 5 days ofd prednisone.

## 2013-04-08 NOTE — Progress Notes (Signed)
Chief Complaint  Patient presents with  . Rash    Started doxycycline on 03/26/13 for sinus infection.  The rash appeared 3 days ago.  Says the rash is "itchy."    HPI: Patient comes in for an acute problem PCP not available. Saw Dr. Tylene Fantasia as above treated with doxycycline for sinus infection has one more pill left. Noted about 3-4 days ago when she was at the beach ( had been on pier  fishing )is somewhat itchy blotchy rash started behind the left ear neck and now is on the upper chest arms face. It is not all over her body there is no swelling fever or severe pain. She states that her sinus infection is a lot better. She states she had a similar rash like this uncertain if it was related to doxycycline at some point was treated for folliculitis. At that time she was treated with clindamycin. She states she may have had some rashes in the sun in early spring.  ROS: See pertinent positives and negatives per HPI. No systemic symptoms or joint pains  Past Medical History  Diagnosis Date  . Depression   . Neonatal jaundice   . UTI (lower urinary tract infection)     Family History  Problem Relation Age of Onset  . Hyperlipidemia Mother     History   Social History  . Marital Status: Single    Spouse Name: N/A    Number of Children: N/A  . 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 Smoking status: Current Every Day Smoker -- 1.00 packs/day  . Smokeless tobacco: None  . Alcohol Use: Yes  . Drug Use: No  . Sexual Activity: Yes   Other Topics Concern  . None   Social History Narrative  . None    Outpatient Encounter Prescriptions as of 04/08/2013  Medication Sig Dispense Refill  . doxycycline (VIBRAMYCIN) 100 MG capsule Take 1 capsule (100 mg total) by mouth 2 (two) times daily.  20 capsule  0  . lisdexamfetamine (VYVANSE) 70 MG capsule Take 1 capsule (70 mg total) by mouth every morning.  30  capsule  0  . metFORMIN (GLUCOPHAGE) 500 MG tablet TAKE 1 TABLET BY MOUTH TWICE DAILY WITH FOOD  60 tablet  0  . metFORMIN (GLUCOPHAGE) 500 MG tablet Take 1 tablet (500 mg total) by mouth 2 (two) times daily with a meal.  180 tablet  3  . sertraline (ZOLOFT) 100 MG tablet Take 1 tablet (100 mg total) by mouth daily.  30 tablet  6  . predniSONE (DELTASONE) 20 MG tablet Take 3 po qd for 2 days then 2 po qd for 3 days,or as directed  12 tablet  0  . [DISCONTINUED] sertraline (ZOLOFT) 100 MG tablet TAKE 1 TABLET BY MOUTH EVERY DAY  30 tablet  6   No facility-administered encounter medications on file as of 04/08/2013.    EXAM:  BP 106/66  Pulse 75  Temp(Src) 98.5 F (36.9 C) (Oral)  Wt 226 lb (102.513 kg)  BMI 32.91 kg/m2  SpO2 98%  LMP 03/10/2013  Body mass index is 32.91 kg/(m^2).  GENERAL: vitals reviewed and listed above, alert, oriented, appears well hydrated and in no acute distress with obvious blotchy rash on the upper chest and papular macular on face and upper arms.  HEENT: atraumatic, conjunctiva  clear, no obvious abnormalities on inspection of external nose and  ears OP : no lesion edema or exudate  NECK: no obvious masses on inspection palpation  CV: HRRR, no clubbing cyanosis or  peripheral edema nl cap refill  MS: moves all extremities without noticeable focal  abnormality Skin shows some mild acne on the face and reddened rash papular macular on sun exposed areas anterior chest with some sparing where the swim suit straps were. This area is nontender and an exposed minimal rash on the upper back covered by hair. Face has discrete red bumps but no vesicles. No edema. PSYCH: pleasant and cooperative, no obvious depression or anxiety  ASSESSMENT AND PLAN:  Discussed the following assessment and plan:  Pruritic rash - Uncertain cause pattern appears to be sun UV exposed areas perhaps atypical photosensitivity? Doesn't seem like a true allergic reaction to Doxy Discussed  differential diagnosis location of the rash appears to be consistent with something related to sun or UV exposure on the doxycycline however is not typical for a reactive rash.papular component. Advised stop the doxycycline cool compresses avoid sun and UV avoidance if increasing itching over the weekend can add 5 days of prednisone. Am hesitant to add an antibiotic for possible folliculitis as this seems atypical for that and it developed on doxycycline. Sinusitis is resolved She's getting married on October 28 and would like her rash cleared up. Have her followup next week and if not better consider getting dermatology involved.` -Patient advised to return or notify health care team  if symptoms worsen or persist or new concerns arise.  Patient Instructions  Uncertain cause  But seems to be related to photosensitivity and doxy  Stop the doxycycline .   For now and cover up No uv exposures  Should fade but if doesn't can take the 5 days ofd prednisone.      Neta Mends. Rennae Ferraiolo M.D.

## 2013-04-15 ENCOUNTER — Encounter: Payer: Self-pay | Admitting: Internal Medicine

## 2013-04-15 ENCOUNTER — Ambulatory Visit (INDEPENDENT_AMBULATORY_CARE_PROVIDER_SITE_OTHER): Payer: BC Managed Care – PPO | Admitting: Internal Medicine

## 2013-04-15 VITALS — BP 114/66 | HR 63 | Temp 98.3°F | Wt 223.0 lb

## 2013-04-15 DIAGNOSIS — L282 Other prurigo: Secondary | ICD-10-CM

## 2013-04-15 DIAGNOSIS — L719 Rosacea, unspecified: Secondary | ICD-10-CM

## 2013-04-15 MED ORDER — CLINDAMYCIN PHOSPHATE 1 % EX SOLN: Freq: Two times a day (BID) | CUTANEOUS | Status: DC

## 2013-04-15 NOTE — Progress Notes (Signed)
Chief Complaint  Patient presents with  . Follow-up    Rash.  Did not fill the prednisone prescription.  Feeling much better.    HPI: Better but still has   Sig rash .  Didn't take the pred and stayed out of son. Now just the papules acne like   No fever / topical cleocoin had helped in past getting married  Oct 28th  Got panoxyl otc.   ROS: See pertinent positives and negatives per HPI.  Past Medical History  Diagnosis Date  . Depression   . Neonatal jaundice   . UTI (lower urinary tract infection)     Family History  Problem Relation Age of Onset  . Hyperlipidemia Mother     History   Social History  . Marital Status: Single    Spouse Name: N/A    Number of Children: N/A  . 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 Smoking status: Current Every Day Smoker -- 1.00 packs/day  . Smokeless tobacco: None  . Alcohol Use: Yes  . Drug Use: No  . Sexual Activity: Yes   Other Topics Concern  . None   Social History Narrative  . None    Outpatient Encounter Prescriptions as of 04/15/2013  Medication Sig Dispense Refill  . lisdexamfetamine (VYVANSE) 70 MG capsule Take 1 capsule (70 mg total) by mouth every morning.  30 capsule  0  . metFORMIN (GLUCOPHAGE) 500 MG tablet TAKE 1 TABLET BY MOUTH TWICE DAILY WITH FOOD  60 tablet  0  . metFORMIN (GLUCOPHAGE) 500 MG tablet Take 1 tablet (500 mg total) by mouth 2 (two) times daily with a meal.  180 tablet  3  . sertraline (ZOLOFT) 100 MG tablet Take 1 tablet (100 mg total) by mouth daily.  30 tablet  6  . clindamycin (CLEOCIN T) 1 % external solution Apply topically 2 (two) times daily.  30 mL  1  . [DISCONTINUED] predniSONE (DELTASONE) 20 MG tablet Take 3 po qd for 2 days then 2 po qd for 3 days,or as directed  12 tablet  0   No facility-administered encounter medications on file as of 04/15/2013.    EXAM:  BP 114/66  Pulse 63  Temp(Src) 98.3  F (36.8 C) (Oral)  Wt 223 lb (101.152 kg)  BMI 32.47 kg/m2  SpO2 98%  LMP 04/13/2013  Body mass index is 32.47 kg/(m^2).  GENERAL: vitals reviewed and listed above, alert, oriented, appears well hydrated and in no acute distress  Skin  No erythem and blothciness  Now papular acneifome typpe rash  Through out the face yupper neck and back.   No vesicles or pusutles at this time    MS: moves all extremities without noticeable focal  abnormality  PSYCH: pleasant and cooperative, no obvious depression or anxiety  ASSESSMENT AND PLAN:  Discussed the following assessment and plan:  Pruritic rash - Plan: Ambulatory referral to Dermatology  ACNE ROSACEA - Plan: Ambulatory referral to Dermatology Over all better but still significant  Add topical cleocin and get consult  If possible  Wedding on oct 28th? -Patient advised to return or notify health care team  if symptoms worsen or persist or new concerns arise.  Patient Instructions  Topical cleomuycine as we discussed . Working on getting derm to see y ou.      Neta Mends. Amaad Byers M.D.

## 2013-04-15 NOTE — Patient Instructions (Signed)
Topical cleomuycine as we discussed . Working on getting derm to see y ou.

## 2013-04-19 ENCOUNTER — Encounter: Payer: Self-pay | Admitting: Internal Medicine

## 2013-06-21 ENCOUNTER — Telehealth: Payer: Self-pay | Admitting: Internal Medicine

## 2013-06-21 ENCOUNTER — Other Ambulatory Visit: Payer: Self-pay | Admitting: *Deleted

## 2013-06-21 MED ORDER — LISDEXAMFETAMINE DIMESYLATE 70 MG PO CAPS: 70.0000 mg | ORAL_CAPSULE | ORAL | Status: DC

## 2013-06-21 NOTE — Telephone Encounter (Signed)
Pt needs new rx vyvanse 70 mg #30. Pt is requesting rxs for next 3 months

## 2013-06-21 NOTE — Telephone Encounter (Signed)
Printed,and will call patient to pick up after dr Lovell Sheehan signs on wednesday

## 2013-09-19 ENCOUNTER — Telehealth: Payer: Self-pay | Admitting: Internal Medicine

## 2013-09-19 MED ORDER — LISDEXAMFETAMINE DIMESYLATE 70 MG PO CAPS: 70.0000 mg | ORAL_CAPSULE | ORAL | Status: DC

## 2013-09-19 NOTE — Telephone Encounter (Signed)
Pt requesting refill of lisdexamfetamine (VYVANSE) 70 MG capsule.  Requesting to pick up on 09/21/13, if possible.

## 2013-09-19 NOTE — Telephone Encounter (Signed)
Left detailed message Rx ready for pickup, will be at the front desk. Rx printed and signed. 

## 2013-11-29 ENCOUNTER — Encounter: Payer: Self-pay | Admitting: *Deleted

## 2013-11-30 ENCOUNTER — Telehealth: Payer: Self-pay | Admitting: Internal Medicine

## 2013-11-30 ENCOUNTER — Other Ambulatory Visit (INDEPENDENT_AMBULATORY_CARE_PROVIDER_SITE_OTHER): Payer: 59

## 2013-11-30 DIAGNOSIS — Z0000 Encounter for general adult medical examination without abnormal findings: Secondary | ICD-10-CM

## 2013-11-30 LAB — POCT URINALYSIS DIPSTICK
Bilirubin, UA: NEGATIVE
Blood, UA: NEGATIVE
GLUCOSE UA: NEGATIVE
Ketones, UA: NEGATIVE
Leukocytes, UA: NEGATIVE
NITRITE UA: NEGATIVE
Protein, UA: NEGATIVE
Spec Grav, UA: 1.02
UROBILINOGEN UA: 0.2
pH, UA: 7

## 2013-11-30 LAB — TSH: TSH: 1.02 u[IU]/mL (ref 0.35–4.50)

## 2013-11-30 LAB — CBC WITH DIFFERENTIAL/PLATELET
BASOS ABS: 0 10*3/uL (ref 0.0–0.1)
Basophils Relative: 0.3 % (ref 0.0–3.0)
EOS ABS: 0.2 10*3/uL (ref 0.0–0.7)
Eosinophils Relative: 2.2 % (ref 0.0–5.0)
HEMATOCRIT: 43.7 % (ref 36.0–46.0)
HEMOGLOBIN: 14.9 g/dL (ref 12.0–15.0)
LYMPHS ABS: 2 10*3/uL (ref 0.7–4.0)
LYMPHS PCT: 23.5 % (ref 12.0–46.0)
MCHC: 34.1 g/dL (ref 30.0–36.0)
MCV: 93.5 fl (ref 78.0–100.0)
MONO ABS: 0.7 10*3/uL (ref 0.1–1.0)
Monocytes Relative: 8.7 % (ref 3.0–12.0)
NEUTROS ABS: 5.4 10*3/uL (ref 1.4–7.7)
Neutrophils Relative %: 65.3 % (ref 43.0–77.0)
Platelets: 308 10*3/uL (ref 150.0–400.0)
RBC: 4.68 Mil/uL (ref 3.87–5.11)
RDW: 13.2 % (ref 11.5–15.5)
WBC: 8.3 10*3/uL (ref 4.0–10.5)

## 2013-11-30 LAB — HEPATIC FUNCTION PANEL
ALBUMIN: 4.3 g/dL (ref 3.5–5.2)
ALT: 19 U/L (ref 0–35)
AST: 18 U/L (ref 0–37)
Alkaline Phosphatase: 62 U/L (ref 39–117)
Bilirubin, Direct: 0.1 mg/dL (ref 0.0–0.3)
Total Bilirubin: 0.7 mg/dL (ref 0.2–1.2)
Total Protein: 7.4 g/dL (ref 6.0–8.3)

## 2013-11-30 LAB — LIPID PANEL
CHOLESTEROL: 164 mg/dL (ref 0–200)
HDL: 41.2 mg/dL (ref >39.00)
LDL Cholesterol: 107 mg/dL — ABNORMAL HIGH (ref 0–99)
TRIGLYCERIDES: 80 mg/dL (ref 0.0–149.0)
Total CHOL/HDL Ratio: 4
VLDL: 16 mg/dL (ref 0.0–40.0)

## 2013-11-30 NOTE — Telephone Encounter (Signed)
Patient needs prescriptions refilled for Vivance and Sertraline. Will pick up when she comes back for her physical.

## 2013-11-30 NOTE — Telephone Encounter (Signed)
This can filled on 12/07/13 when she comes in for her CPX

## 2013-12-07 ENCOUNTER — Encounter: Payer: Self-pay | Admitting: Family

## 2013-12-07 ENCOUNTER — Ambulatory Visit (INDEPENDENT_AMBULATORY_CARE_PROVIDER_SITE_OTHER): Payer: 59 | Admitting: Family

## 2013-12-07 ENCOUNTER — Telehealth: Payer: Self-pay | Admitting: Internal Medicine

## 2013-12-07 VITALS — BP 124/76 | HR 88 | Temp 99.0°F | Ht 69.5 in | Wt 224.0 lb

## 2013-12-07 DIAGNOSIS — Z0000 Encounter for general adult medical examination without abnormal findings: Secondary | ICD-10-CM

## 2013-12-07 MED ORDER — LISDEXAMFETAMINE DIMESYLATE 70 MG PO CAPS: 70.0000 mg | ORAL_CAPSULE | ORAL | Status: DC

## 2013-12-07 MED ORDER — SERTRALINE HCL 100 MG PO TABS: 100.0000 mg | ORAL_TABLET | Freq: Every day | ORAL | Status: DC

## 2013-12-07 NOTE — Telephone Encounter (Signed)
Relevant patient education mailed to patient.  

## 2013-12-07 NOTE — Progress Notes (Signed)
Pre visit review using our clinic review tool, if applicable. No additional management support is needed unless otherwise documented below in the visit note. 

## 2013-12-07 NOTE — Progress Notes (Signed)
Subjective:    Patient ID: Sara Reid, female    DOB: December 06, 1985, 28 y.o.   MRN: [id number]  HPI  28 year old caucasian female presents or annual wellness exam.    Acne worsening in context of discontinuing Metformin (for PCOS) one month ago as she begin trying to conceive.   She also describes R TMJ concerns and is scheduled to follow up with Dr. Jonni Sanger, DDS in a few months.   Her GYN care is with the [facility].   Labs from 11/30/13 were reviewed with patient. Discussed LDL trending and her dietary changes in the last 2 years. Also noted TSH trending and will continue to monitor.    Review of Systems  Constitutional: Negative.   HENT: Negative.   Eyes: Negative.   Respiratory: Negative.   Cardiovascular: Negative.   Gastrointestinal: Negative.   Endocrine: Negative.   Genitourinary: Negative.   Musculoskeletal: Negative.   Skin: Negative.   Allergic/Immunologic: Negative.   Neurological: Negative.   Hematological: Negative.   Psychiatric/Behavioral: Negative.    Past Medical History  Diagnosis Date  . Depression   . Neonatal jaundice   . UTI (lower urinary tract infection)     History   Social History  . Marital Status: Single    Spouse Name: N/A    Number of Children: N/A  . 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 Smoking status: Current Every Day Smoker -- 1.00 packs/day  . Smokeless tobacco: Not on file  . Alcohol Use: Yes  . Drug Use: No  . Sexual Activity: Yes   Other Topics Concern  . Not on file   Social History Narrative  . No narrative on file    Past Surgical History  Procedure Laterality Date  . Breast biopsy      Family History  Problem Relation Age of Onset  . Hyperlipidemia Mother     Allergies  Allergen Reactions  . Amoxicillin-Pot Clavulanate     REACTION: rash  . Cefaclor     REACTION: rash  . Eryzole     REACTION: rash     Current Outpatient Prescriptions on File Prior to Visit  Medication Sig Dispense Refill  . lisdexamfetamine (VYVANSE) 70 MG capsule Take 1 capsule (70 mg total) by mouth every morning.  30 capsule  0  . lisdexamfetamine (VYVANSE) 70 MG capsule Take 1 capsule (70 mg total) by mouth every morning.  30 capsule  0  . lisdexamfetamine (VYVANSE) 70 MG capsule Take 1 capsule (70 mg total) by mouth every morning.  30 capsule  0  . sertraline (ZOLOFT) 100 MG tablet Take 1 tablet (100 mg total) by mouth daily.  30 tablet  6  . metFORMIN (GLUCOPHAGE) 500 MG tablet TAKE 1 TABLET BY MOUTH TWICE DAILY WITH FOOD  60 tablet  0   No current facility-administered medications on file prior to visit.    BP 124/76  Pulse 88  Temp(Src) 99 F (37.2 C) (Oral)  Ht 5' 9.5" (1.765 m)  Wt 224 lb (101.606 kg)  BMI 32.62 kg/m2     Objective:   Physical Exam  Constitutional: She is oriented to person, place, and time. She appears well-developed and well-nourished.  HENT:  Head: Normocephalic.  Right Ear: External ear normal.  Left Ear: External ear normal.  Nose: Nose normal.  Mouth/Throat: Oropharynx is clear and moist.  Eyes: Conjunctivae and  EOM are normal. Pupils are equal, round, and reactive to light.  Neck: Normal range of motion. Neck supple. No thyromegaly present.  Cardiovascular: Normal rate, regular rhythm and normal heart sounds.   Pulmonary/Chest: Effort normal and breath sounds normal.  Abdominal: Soft. Bowel sounds are normal.  Musculoskeletal: Normal range of motion.  Neurological: She is alert and oriented to person, place, and time. She has normal reflexes. She displays normal reflexes. No cranial nerve deficit. Coordination normal.  Skin: Skin is warm and dry.  Psychiatric: She has a normal mood and affect.      Assessment & Plan:  Sara Reid was seen today for annual exam.  Diagnoses and associated orders for this visit:  Wellness examination  Labs were reviewed. Patient  verbalized understanding of lab results. Dietary changes discussed in context of LDL elevation. To continue to monitor TSH in context of Metformin discontinuation and weight changes.   Follow up in 6 months.

## 2013-12-07 NOTE — Patient Instructions (Signed)
Temporomandibular Problems  Temporomandibular joint (TMJ) dysfunction means there are problems with the joint between your jaw and your skull. This is a joint lined by cartilage like other joints in your body but also has a small disc in the joint which keeps the bones from rubbing on each other. These joints are like other joints and can get inflamed (sore) from arthritis and other problems. When this joint gets sore, it can cause headaches and pain in the jaw and the face. CAUSES  Usually the arthritic types of problems are caused by soreness in the joint. Soreness in the joint can also be caused by overuse. This may come from grinding your teeth. It may also come from mis-alignment in the joint. DIAGNOSIS Diagnosis of this condition can often be made by history and exam. Sometimes your caregiver may need X-rays or an MRI scan to determine the exact cause. It may be necessary to see your dentist to determine if your teeth and jaws are lined up correctly. TREATMENT  Most of the time this problem is not serious; however, sometimes it can persist (become chronic). When this happens medications that will cut down on inflammation (soreness) help. Sometimes a shot of cortisone into the joint will be helpful. If your teeth are not aligned it may help for your dentist to make a splint for your mouth that can help this problem. If no physical problems can be found, the problem may come from tension. If tension is found to be the cause, biofeedback or relaxation techniques may be helpful. HOME CARE INSTRUCTIONS   Later in the day, applications of ice packs may be helpful. Ice can be used in a plastic bag with a towel around it to prevent frostbite to skin. This may be used about every 2 hours for 20 to 30 minutes, as needed while awake, or as directed by your caregiver.  Only take over-the-counter or prescription medicines for pain, discomfort, or fever as directed by your caregiver.  If physical therapy was  prescribed, follow your caregiver's directions.  Wear mouth appliances as directed if they were given. Document Released: 03/18/2001 Document Revised: 09/15/2011 Document Reviewed: 06/25/2008 ExitCare Patient Information 2014 [employer].  

## 2013-12-21 ENCOUNTER — Encounter: Payer: Self-pay | Admitting: Internal Medicine

## 2014-03-14 ENCOUNTER — Telehealth: Payer: Self-pay | Admitting: Family

## 2014-03-14 NOTE — Telephone Encounter (Signed)
Pt request refill of the following: lisdexamfetamine (VYVANSE) 70 MG capsule    Phamacy:  Pick up

## 2014-03-14 NOTE — Telephone Encounter (Signed)
Pt needs appointment

## 2014-03-15 MED ORDER — LISDEXAMFETAMINE DIMESYLATE 70 MG PO CAPS: 70.0000 mg | ORAL_CAPSULE | ORAL | Status: DC

## 2014-03-15 NOTE — Telephone Encounter (Signed)
Lm on vm that appt has been cancelled and can pu rx today.

## 2014-03-15 NOTE — Telephone Encounter (Signed)
Pt is right. Please cancel 03/31/14 appointment. I will print Rxs for pt to pick up today. My apologies. Thank you.

## 2014-03-15 NOTE — Telephone Encounter (Signed)
lmovm for pt to call back and schedule an appt

## 2014-03-15 NOTE — Telephone Encounter (Signed)
Pt states padonda told her at her cpe in June that all she needed to do was call in. Pt instructed to make 6 mo fup. And she did.  So pt surprised she needs visit now. Pt is out and would like to know if she could get one mo and pt made appt for 9/25.   Pt has meetings and would like to pu today please. vyvanse 70

## 2014-03-31 ENCOUNTER — Ambulatory Visit: Payer: 59 | Admitting: Family

## 2014-06-12 ENCOUNTER — Telehealth: Payer: Self-pay | Admitting: Family

## 2014-06-12 MED ORDER — SERTRALINE HCL 100 MG PO TABS: 100.0000 mg | ORAL_TABLET | Freq: Every day | ORAL | Status: DC

## 2014-06-12 MED ORDER — LISDEXAMFETAMINE DIMESYLATE 70 MG PO CAPS: 70.0000 mg | ORAL_CAPSULE | ORAL | Status: DC

## 2014-06-12 NOTE — Telephone Encounter (Signed)
Ok to fill for 1 month, per [place].

## 2014-06-12 NOTE — Telephone Encounter (Signed)
Left message to advise pt Rx ready for pick up 

## 2014-06-12 NOTE — Telephone Encounter (Signed)
Pt request refill of the following: lisdexamfetamine (VYVANSE) 70 MG capsule, sertraline (ZOLOFT) 100 MG tablet   Pt said she would like to pick up  tomorrow  Afternoon   Phamacy:  [facility] [address]

## 2014-07-07 NOTE — L&D Delivery Note (Signed)
29 y/o G1P0 @ [redacted]w[redacted]d estimated gestational age (as dated by LMP c/w 20 week ultrasound) presents for IOL due to gestational HTN.   Pregnancy complicated by: 1) GDMA1- diet controlled. Followed by NST twice weekly. 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 (  daily), followed by Neurology, has been off medication for several weeks 4) Rh negative, s/p RhoGAM 5) Anemia in pregnancy: Hgb 10.9, iron daily, last Hgb 8/22: 10.8  Induction of labor was started with cytotec and continued with Pitocin per protocol.  For GBS prophylaxis, she received IV Vancomycin.  AROM was also performed and internal monitors were placed due to difficulty monitoring both contractions and heart tones.  She progressed to complete, NSVD with 2nd degree laceration.  At 11:08 PM a viable female was delivered via Vaginal, Spontaneous Delivery (Presentation: ; Occiput Anterior).  APGAR: 9, 9; weight 7 lb 5.5 oz (3331 g).   Placenta status: Intact, Spontaneous Pathology.  Cord: 3 vessels with the following complications: None.    Anesthesia: Epidural  Episiotomy: None Lacerations: 2nd degree;Labial;Perineal Suture Repair: 2.0 3.0 vicryl Est. Blood Loss (mL): 300  Mom to postpartum.  Baby to Couplet care / Skin to Skin.  Sara Reid, M 03/02/2015, 6:43 AM

## 2014-07-12 ENCOUNTER — Ambulatory Visit (INDEPENDENT_AMBULATORY_CARE_PROVIDER_SITE_OTHER): Payer: 59 | Admitting: Family

## 2014-07-12 ENCOUNTER — Encounter: Payer: Self-pay | Admitting: Family

## 2014-07-12 VITALS — BP 102/74 | HR 101 | Temp 98.4°F | Wt 235.7 lb

## 2014-07-12 DIAGNOSIS — F988 Other specified behavioral and emotional disorders with onset usually occurring in childhood and adolescence: Secondary | ICD-10-CM

## 2014-07-12 DIAGNOSIS — F329 Major depressive disorder, single episode, unspecified: Secondary | ICD-10-CM

## 2014-07-12 DIAGNOSIS — F32A Depression, unspecified: Secondary | ICD-10-CM

## 2014-07-12 DIAGNOSIS — Z720 Tobacco use: Secondary | ICD-10-CM

## 2014-07-12 DIAGNOSIS — F909 Attention-deficit hyperactivity disorder, unspecified type: Secondary | ICD-10-CM

## 2014-07-12 MED ORDER — LISDEXAMFETAMINE DIMESYLATE 70 MG PO CAPS: 70.0000 mg | ORAL_CAPSULE | ORAL | Status: DC

## 2014-07-12 MED ORDER — SERTRALINE HCL 100 MG PO TABS: 100.0000 mg | ORAL_TABLET | Freq: Every day | ORAL | Status: DC

## 2014-07-12 NOTE — Progress Notes (Signed)
Pre visit review using our clinic review tool, if applicable. No additional management support is needed unless otherwise documented below in the visit note. 

## 2014-07-12 NOTE — Patient Instructions (Signed)
Smoking Cessation Quitting smoking is important to your health and has many advantages. However, it is not always easy to quit since nicotine is a very addictive drug. Oftentimes, people try 3 times or more before being able to quit. This document explains the best ways for you to prepare to quit smoking. Quitting takes hard work and a lot of effort, but you can do it. ADVANTAGES OF QUITTING SMOKING  You will live longer, feel better, and live better.  Your body will feel the impact of quitting smoking almost immediately.  Within 20 minutes, blood pressure decreases. Your pulse returns to its normal level.  After 8 hours, carbon monoxide levels in the blood return to normal. Your oxygen level increases.  After 24 hours, the chance of having a heart attack starts to decrease. Your breath, hair, and body stop smelling like smoke.  After 48 hours, damaged nerve endings begin to recover. Your sense of taste and smell improve.  After 72 hours, the body is virtually free of nicotine. Your bronchial tubes relax and breathing becomes easier.  After 2 to 12 weeks, lungs can hold more air. Exercise becomes easier and circulation improves.  The risk of having a heart attack, stroke, cancer, or lung disease is greatly reduced.  After 1 year, the risk of coronary heart disease is cut in half.  After 5 years, the risk of stroke falls to the same as a nonsmoker.  After 10 years, the risk of lung cancer is cut in half and the risk of other cancers decreases significantly.  After 15 years, the risk of coronary heart disease drops, usually to the level of a nonsmoker.  If you are pregnant, quitting smoking will improve your chances of having a healthy baby.  The people you live with, especially any children, will be healthier.  You will have extra money to spend on things other than cigarettes. QUESTIONS TO THINK ABOUT BEFORE ATTEMPTING TO QUIT You may want to talk about your answers with your  health care provider.  Why do you want to quit?  If you tried to quit in the past, what helped and what did not?  What will be the most difficult situations for you after you quit? How will you plan to handle them?  Who can help you through the tough times? Your family? Friends? A health care provider?  What pleasures do you get from smoking? What ways can you still get pleasure if you quit? Here are some questions to ask your health care provider:  How can you help me to be successful at quitting?  What medicine do you think would be best for me and how should I take it?  What should I do if I need more help?  What is smoking withdrawal like? How can I get information on withdrawal? GET READY  Set a quit date.  Change your environment by getting rid of all cigarettes, ashtrays, matches, and lighters in your home, car, or work. Do not let people smoke in your home.  Review your past attempts to quit. Think about what worked and what did not. GET SUPPORT AND ENCOURAGEMENT You have a better chance of being successful if you have help. You can get support in many ways.  Tell your family, friends, and coworkers that you are going to quit and need their support. Ask them not to smoke around you.  Get individual, group, or telephone counseling and support. Programs are available at local hospitals and health centers. Call   your local health department for information about programs in your area.  Spiritual beliefs and practices may help some smokers quit.  Download a "quit meter" on your computer to keep track of quit statistics, such as how long you have gone without smoking, cigarettes not smoked, and money saved.  Get a self-help book about quitting smoking and staying off tobacco. LEARN NEW SKILLS AND BEHAVIORS  Distract yourself from urges to smoke. Talk to someone, go for a walk, or occupy your time with a task.  Change your normal routine. Take a different route to work.  Drink tea instead of coffee. Eat breakfast in a different place.  Reduce your stress. Take a hot bath, exercise, or read a book.  Plan something enjoyable to do every day. Reward yourself for not smoking.  Explore interactive web-based programs that specialize in helping you quit. GET MEDICINE AND USE IT CORRECTLY Medicines can help you stop smoking and decrease the urge to smoke. Combining medicine with the above behavioral methods and support can greatly increase your chances of successfully quitting smoking.  Nicotine replacement therapy helps deliver nicotine to your body without the negative effects and risks of smoking. Nicotine replacement therapy includes nicotine gum, lozenges, inhalers, nasal sprays, and skin patches. Some may be available over-the-counter and others require a prescription.  Antidepressant medicine helps people abstain from smoking, but how this works is unknown. This medicine is available by prescription.  Nicotinic receptor partial agonist medicine simulates the effect of nicotine in your brain. This medicine is available by prescription. Ask your health care provider for advice about which medicines to use and how to use them based on your health history. Your health care provider will tell you what side effects to look out for if you choose to be on a medicine or therapy. Carefully read the information on the package. Do not use any other product containing nicotine while using a nicotine replacement product.  RELAPSE OR DIFFICULT SITUATIONS Most relapses occur within the first 3 months after quitting. Do not be discouraged if you start smoking again. Remember, most people try several times before finally quitting. You may have symptoms of withdrawal because your body is used to nicotine. You may crave cigarettes, be irritable, feel very hungry, cough often, get headaches, or have difficulty concentrating. The withdrawal symptoms are only temporary. They are strongest  when you first quit, but they will go away within 10-14 days. To reduce the chances of relapse, try to:  Avoid drinking alcohol. Drinking lowers your chances of successfully quitting.  Reduce the amount of caffeine you consume. Once you quit smoking, the amount of caffeine in your body increases and can give you symptoms, such as a rapid heartbeat, sweating, and anxiety.  Avoid smokers because they can make you want to smoke.  Do not let weight gain distract you. Many smokers will gain weight when they quit, usually less than 10 pounds. Eat a healthy diet and stay active. You can always lose the weight gained after you quit.  Find ways to improve your mood other than smoking. FOR MORE INFORMATION  www.smokefree.gov  Document Released: 06/17/2001 Document Revised: 11/07/2013 Document Reviewed: 10/02/2011 ExitCare Patient Information 2015 [employer]. This information is not intended to replace advice given to you by your health care provider. Make sure you discuss any questions you have with your health care provider.  

## 2014-07-13 ENCOUNTER — Ambulatory Visit: Payer: 59 | Admitting: Family

## 2014-07-13 ENCOUNTER — Encounter: Payer: Self-pay | Admitting: Family

## 2014-07-13 NOTE — Progress Notes (Signed)
Subjective:    Patient ID: Sara Reid, female    DOB: 06-11-1986, 29 y.o.   MRN: [id number]  HPI 29 year old white female, nonsmoker is in today for a recheck of ADD, Depression, and PCOS. She is off Metformin and under the care of GYN for PCOS. She is stable on medications. Request refills. No weight loss. Mood stable.    Review of Systems  Constitutional: Negative.   HENT: Negative.   Respiratory: Negative.   Cardiovascular: Negative.   Gastrointestinal: Negative.   Endocrine: Negative.  Negative for cold intolerance and heat intolerance.  Genitourinary: Negative.   Musculoskeletal: Negative.   Skin: Negative.   Neurological: Negative.   Hematological: Negative.   Psychiatric/Behavioral: Negative.    Past Medical History  Diagnosis Date  . Depression   . Neonatal jaundice   . UTI (lower urinary tract infection)     History   Social History  . Marital Status: Single    Spouse Name: N/A    Number of Children: N/A  . Years of Education: N/A   Occupational History  . part time Theatre stage managerstudent and warehouse worker    Social History Main Topics  . Smoking status: Current Every Day Smoker -- 1.00 packs/day  . Smokeless tobacco: Not on file  . Alcohol Use: Yes  . Drug Use: No  . Sexual Activity: Yes   Other Topics Concern  . Not on file   Social History Narrative    Past Surgical History  Procedure Laterality Date  . Breast biopsy      Family History  Problem Relation Age of Onset  . Hyperlipidemia Mother     Allergies  Allergen Reactions  . Amoxicillin-Pot Clavulanate     REACTION: rash  . Cefaclor     REACTION: rash  . Eryzole     REACTION: rash    No current outpatient prescriptions on file prior to visit.   No current facility-administered medications on file prior to visit.    BP 102/74 mmHg  Pulse 101  Temp(Src) 98.4 F (36.9 C) (Oral)  Wt 235 lb 11.2 oz (106.913 kg)chart    Objective:   Physical Exam  Constitutional: She is  oriented to person, place, and time. She appears well-developed and well-nourished.  HENT:  Right Ear: External ear normal.  Left Ear: External ear normal.  Nose: Nose normal.  Mouth/Throat: Oropharynx is clear and moist.  Neck: Normal range of motion. Neck supple.  Cardiovascular: Normal rate, regular rhythm and normal heart sounds.   Pulmonary/Chest: Effort normal and breath sounds normal.  Abdominal: Soft. Bowel sounds are normal.  Musculoskeletal: Normal range of motion.  Neurological: She is alert and oriented to person, place, and time.  Skin: Skin is warm and dry.  Psychiatric: She has a normal mood and affect.          Assessment & Plan:  Sara Reid was seen today for follow-up.  Diagnoses and associated orders for this visit:  ADD (attention deficit disorder)  Depression  Tobacco abuse  Other Orders - lisdexamfetamine (VYVANSE) 70 MG capsule; Take 1 capsule (70 mg total) by mouth every morning. - lisdexamfetamine (VYVANSE) 70 MG capsule; Take 1 capsule (70 mg total) by mouth every morning. - lisdexamfetamine (VYVANSE) 70 MG capsule; Take 1 capsule (70 mg total) by mouth every morning. - sertraline (ZOLOFT) 100 MG tablet; Take 1 tablet (100 mg total) by mouth daily.    Continue current medications. Recheck in 3 months. Exercise 3-4 days a week.

## 2014-08-18 LAB — OB RESULTS CONSOLE HIV ANTIBODY (ROUTINE TESTING): HIV: NONREACTIVE

## 2014-08-22 LAB — OB RESULTS CONSOLE RUBELLA ANTIBODY, IGM: RUBELLA: IMMUNE

## 2014-08-22 LAB — OB RESULTS CONSOLE RPR: RPR: NONREACTIVE

## 2014-08-22 LAB — OB RESULTS CONSOLE HEPATITIS B SURFACE ANTIGEN: HEP B S AG: NEGATIVE

## 2014-08-28 ENCOUNTER — Other Ambulatory Visit: Payer: Self-pay | Admitting: Obstetrics & Gynecology

## 2014-08-28 ENCOUNTER — Other Ambulatory Visit (HOSPITAL_COMMUNITY)
Admission: RE | Admit: 2014-08-28 | Discharge: 2014-08-28 | Disposition: A | Discharge status: Home / Self Care | Payer: 59 | Source: Ambulatory Visit | Attending: Obstetrics & Gynecology | Admitting: Obstetrics & Gynecology

## 2014-08-28 DIAGNOSIS — Z01419 Encounter for gynecological examination (general) (routine) without abnormal findings: Secondary | ICD-10-CM | POA: Insufficient documentation

## 2014-08-28 DIAGNOSIS — Z113 Encounter for screening for infections with a predominantly sexual mode of transmission: Secondary | ICD-10-CM | POA: Insufficient documentation

## 2014-08-28 LAB — OB RESULTS CONSOLE GC/CHLAMYDIA
Chlamydia: NEGATIVE
Gonorrhea: NEGATIVE

## 2014-08-30 LAB — CYTOLOGY - PAP

## 2014-10-11 ENCOUNTER — Encounter: Payer: Self-pay | Admitting: Family Medicine

## 2014-10-11 ENCOUNTER — Ambulatory Visit (INDEPENDENT_AMBULATORY_CARE_PROVIDER_SITE_OTHER): Payer: PRIVATE HEALTH INSURANCE | Admitting: Family Medicine

## 2014-10-11 VITALS — BP 124/78 | HR 98 | Temp 98.1°F | Wt 233.0 lb

## 2014-10-11 DIAGNOSIS — Z720 Tobacco use: Secondary | ICD-10-CM | POA: Diagnosis not present

## 2014-10-11 DIAGNOSIS — F909 Attention-deficit hyperactivity disorder, unspecified type: Secondary | ICD-10-CM | POA: Diagnosis not present

## 2014-10-11 DIAGNOSIS — F988 Other specified behavioral and emotional disorders with onset usually occurring in childhood and adolescence: Secondary | ICD-10-CM

## 2014-10-11 MED ORDER — LISDEXAMFETAMINE DIMESYLATE 30 MG PO CAPS: 30.0000 mg | ORAL_CAPSULE | Freq: Every day | ORAL | Status: DC

## 2014-10-11 NOTE — Assessment & Plan Note (Signed)
S: perhaps .1-2 per day A/P: strongly encouraged cessation especially given she is pregnant. She seemed to take this to heart.

## 2014-10-11 NOTE — Progress Notes (Signed)
Pre visit review using our clinic review tool, if applicable. No additional management support is needed unless otherwise documented below in the visit note. 

## 2014-10-11 NOTE — Patient Instructions (Addendum)
I think psychiatry is best fitted to help you with this complex situation. I thank you for getting a list together of psychiatrists. Please call and get an appointment.   Until the appointment, try the lower dose 30 mg pill. We discussed the risks to your child are unclear as this is not well study but there are certainly potential risks. On the other hand, if you cannot prepare for the child or care for the child when he or she arrives, then that places them at risk as well. This is very complicated and certainly psychiatry's aid is appreciated.

## 2014-10-11 NOTE — Progress Notes (Signed)
  Tana ConchStephen Nyquan Selbe, MD Phone: [phone]  Subjective:   Sara Reid is a 29 y.o. year old very pleasant female patient who presents with the following:  ADD- poor control when not taking meds, controlled on -stopped vyvanse on her own around 8 weeks. 10 years of ADHD treatment-adderrall in past, vyvanse for 4 years. 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 Has had extreme difficulty at work.  At home, just sits and does not help much around the home. Husband does not work has some family $ and has invested it well and does not work. 1st child. Hard to get excited about preparing for baby because it is so much to think about and cannot focus to get things ready. Planning on breastfeeding. Unsure on pediatrician.   Occasionally taking 70mg  now-has one whole new rx to fill. Using 3 days a week. Symptoms controlled on rx.   ROS- no chest pain, palpitations, unintentional weight loss  Past Medical History- tobacco abuse (encouraged cessation especially as pregnant-1-2 per day currently), PCOS, depression on zoloft, AD  Medications- reviewed and updated Current Outpatient Prescriptions  Medication Sig Dispense Refill  . lisdexamfetamine (VYVANSE) 70 MG capsule Take 1 capsule (70 mg total) by mouth every morning. 30 capsule 0  . sertraline (ZOLOFT) 100 MG tablet Take 1 tablet (100 mg total) by mouth daily. 90 tablet 1  . lisdexamfetamine (VYVANSE) 70 MG capsule Take 1 capsule (70 mg total) by mouth every morning. (Patient not taking: Reported on 10/11/2014) 30 capsule 0  . lisdexamfetamine (VYVANSE) 70 MG capsule Take 1 capsule (70 mg total) by mouth every morning. (Patient not taking: Reported on 10/11/2014) 30 capsule 0     Objective: BP 124/78 mmHg  Pulse 98  Temp(Src) 98.1 F (36.7 C) (Oral)  Wt 233 lb (105.688 kg) Gen: NAD, resting comfortably.  Assessment/Plan:  Attention deficit disorder Very difficult situation here. Ultimately we decided to trial a  lower dose 30 mg of Vyvanse while she attempts to obtain psychiatry care. We check in with OB/GYN who stated it was okay to continue Vyvanse potentially. My review shows this is rated C in pregnancy. We discussed there are potential risks to the fetus even at low-dose. I think she will best be cared for under the care of psychiatry especially given her history of depression and high risk for worsening of depression if she is unable to work in pregnancy (has been a big struggle if not taking vyvanse) and for postpartum depression after birth. I think she needs counseling.also see avs.     Return precautions advised. Sees me in may to establish care.  Meds ordered this encounter  Medications  . lisdexamfetamine (VYVANSE) 30 MG capsule    Sig: Take 1 capsule (30 mg total) by mouth daily. Lower dose as pregnant. May fill today.    Dispense:  30 capsule    Refill:  0     >50% of 25 minute office visit was spent on counseling (very difficult issue with multiple risks/benefits) and coordination of care (gathering opinion from other providers in our practice)

## 2014-10-11 NOTE — Assessment & Plan Note (Signed)
Very difficult situation here. Ultimately we decided to trial a lower dose 30 mg of Vyvanse while she attempts to obtain psychiatry care. We check in with OB/GYN who stated it was okay to continue Vyvanse potentially. My review shows this is rated C in pregnancy. We discussed there are potential risks to the fetus even at low-dose. I think she will best be cared for under the care of psychiatry especially given her history of depression and high risk for worsening of depression if she is unable to work in pregnancy (has been a big struggle if not taking vyvanse) and for postpartum depression after birth. I think she needs counseling.also see avs.

## 2014-11-15 ENCOUNTER — Ambulatory Visit (INDEPENDENT_AMBULATORY_CARE_PROVIDER_SITE_OTHER): Payer: PRIVATE HEALTH INSURANCE | Admitting: Family Medicine

## 2014-11-15 ENCOUNTER — Encounter: Payer: Self-pay | Admitting: Family Medicine

## 2014-11-15 DIAGNOSIS — F909 Attention-deficit hyperactivity disorder, unspecified type: Secondary | ICD-10-CM

## 2014-11-15 DIAGNOSIS — F988 Other specified behavioral and emotional disorders with onset usually occurring in childhood and adolescence: Secondary | ICD-10-CM

## 2014-11-15 DIAGNOSIS — F329 Major depressive disorder, single episode, unspecified: Secondary | ICD-10-CM

## 2014-11-15 DIAGNOSIS — Z720 Tobacco use: Secondary | ICD-10-CM | POA: Diagnosis not present

## 2014-11-15 DIAGNOSIS — F32A Depression, unspecified: Secondary | ICD-10-CM

## 2014-11-15 MED ORDER — LISDEXAMFETAMINE DIMESYLATE 30 MG PO CAPS: 30.0000 mg | ORAL_CAPSULE | Freq: Every day | ORAL | Status: DC

## 2014-11-15 MED ORDER — SERTRALINE HCL 100 MG PO TABS: 100.0000 mg | ORAL_TABLET | Freq: Every day | ORAL | Status: DC

## 2014-11-15 NOTE — Progress Notes (Signed)
Sara ConchStephen Marlene Pfluger, MD Phone: [phone]  Subjective:  Patient presents today to establish care with me as their new primary care provider. Patient was formerly a patient of Dr. Lovell SheehanJenkins. Chief complaint-noted.   Patient is G1P0000 and is [redacted] weeks pregnant  Tobacco abuse about 1 cigarette per day. Discussed this is too much  ADHD in pregnancy Depression-controlled on zoloft -using vyvanse 30mg  as needed. Patient did not call psychiatry as instructed which I advised as on high risk medication during pregnancy. Pediatrician will certainly need to know about medication usage. Patient  Has felt normal, capable with Vyvanse during workweek but not weekends. Can tell a difference not on 70mg  but is still helpful enough to help her complete her job. Given she has been able to work, depressed mood has improved as she feels that she has a purpose.   Planning on pumping for a month or so and hoping to build up and then restart when go back to work. Planning on 2 months out of work ROS- no chest pain, palpitations, unintentional weight loss. No si/hi  The following were reviewed and entered/updated in epic: Past Medical History  Diagnosis Date  . Depression   . Neonatal jaundice   . UTI (lower urinary tract infection)    Patient Active Problem List   Diagnosis Date Noted  . Tobacco abuse 07/12/2014    Priority: High  . Attention deficit disorder 10/02/2009    Priority: High  . PCOS (polycystic ovarian syndrome) 10/02/2009    Priority: Medium  . Depression 10/02/2009    Priority: Medium  . Pruritic rash 04/08/2013    Priority: Low  . 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 Breast biopsy      benign    Family History  Problem Relation Age of Onset  . Hyperlipidemia Mother   . Depression Mother     anxiety  . Hyperlipidemia Father     Medications- reviewed and updated Current Outpatient Prescriptions  Medication Sig  Dispense Refill  . sertraline (ZOLOFT) 100 MG tablet Take 1 tablet (100 mg total) by mouth daily. 90 tablet 1  . lisdexamfetamine (VYVANSE) 30 MG capsule Take 1 capsule (30 mg total) by mouth daily. Lower dose as pregnant. May fill 01/24/15 30 capsule 0   No current facility-administered medications for this visit.    Allergies-reviewed and updated Allergies  Allergen Reactions  . Amoxicillin-Pot Clavulanate     REACTION: rash  . Cefaclor     REACTION: rash  . Eryzole     REACTION: rash    History   Social History  . Marital Status: Single    Spouse Name: N/A  . Number of Children: N/A  . Years of Education: N/A   Occupational History  . part time Theatre stage managerstudent and warehouse worker    Social History Main Topics  . Smoking status: Current Every Day Smoker -- 1.00 packs/day  . Smokeless tobacco: Not on file  . Alcohol Use: No     Comment: yes outside pregnancy  . Drug Use: No  . Sexual Activity: Yes   Other Topics Concern  . None   Social History Narrative   Married. Pregnant with first child.       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 Oropharynx normal CV: RRR no murmurs rubs or gallops Lungs: CTAB no crackles, wheeze, rhonchi Abdomen: prenant Ext: no edema Skin: warm, dry, no rash  Assessment/Plan:  Attention deficit disorder Strongly recommended again that care be provided by psychiatry. Patient states she has had extensive counseling with ob/gyn and her belief is that benefit outweighs risk as keeps her functional which helps prevent depressed mood. Also helps her in preparation for child.She states she has talked some with obstetrician about trying to remain off medication when she is out of work for 1-2 months and store up  milk and supplement with formula. I would want to talk with ob/gyn as we move into that phase of care. Patient is going to return in 3rd trimester and we will continue discussion. Once again- I told patient I thought the best place for care is under supervision of psychiatry and she declines.    Tobacco abuse 1 cigarette per day. Advised total cessation strongly.    Depression Continue zoloft 100mg  as reasonable control    Please note this is a high risk patient given pregnant and ongoing use of stimulant increasing complexity of visit.   Meds ordered this encounter  Medications  . sertraline (ZOLOFT) 100 MG tablet    Sig: Take 1 tablet (100 mg total) by mouth daily.    Dispense:  90 tablet    Refill:  1  . DISCONTD: lisdexamfetamine (VYVANSE) 30 MG capsule    Sig: Take 1 capsule (30 mg total) by mouth daily. Lower dose as pregnant. May fill today.    Dispense:  30 capsule    Refill:  0  . DISCONTD: lisdexamfetamine (VYVANSE) 30 MG capsule    Sig: Take 1 capsule (30 mg total) by mouth daily. Lower dose as pregnant. May fill 12/20/14    Dispense:  30 capsule    Refill:  0  . lisdexamfetamine (VYVANSE) 30 MG capsule    Sig: Take 1 capsule (30 mg total) by mouth daily. Lower dose as pregnant. May fill 01/24/15    Dispense:  30 capsule    Refill:  0

## 2014-11-15 NOTE — Assessment & Plan Note (Signed)
1 cigarette per day. Advised total cessation strongly.

## 2014-11-15 NOTE — Patient Instructions (Signed)
Let's check in sometime between 34-38 weeks and discuss breastfeeding plan. Talk to your ob and ger her final say before we talk though. Definitely want her blessing for our plan.

## 2014-11-15 NOTE — Assessment & Plan Note (Signed)
Continue zoloft 100mg  as reasonable control

## 2014-11-15 NOTE — Assessment & Plan Note (Signed)
Strongly recommended again that care be provided by psychiatry. Patient states she has had extensive counseling with ob/gyn and her belief is that benefit outweighs risk as keeps her functional which helps prevent depressed mood. Also helps her in preparation for child.She states she has talked some with obstetrician about trying to remain off medication when she is out of work for 1-2 months and store up milk and supplement with formula. I would want to talk with ob/gyn as we move into that phase of care. Patient is going to return in 3rd trimester and we will continue discussion. Once again- I told patient I thought the best place for care is under supervision of psychiatry and she declines.

## 2014-12-08 ENCOUNTER — Other Ambulatory Visit: Payer: Self-pay

## 2014-12-15 ENCOUNTER — Encounter: Payer: Self-pay | Admitting: Family Medicine

## 2014-12-29 LAB — OB RESULTS CONSOLE RPR: RPR: NONREACTIVE

## 2015-01-29 ENCOUNTER — Other Ambulatory Visit: Payer: Self-pay | Admitting: Family

## 2015-01-31 ENCOUNTER — Ambulatory Visit: Payer: PRIVATE HEALTH INSURANCE

## 2015-02-15 ENCOUNTER — Ambulatory Visit (INDEPENDENT_AMBULATORY_CARE_PROVIDER_SITE_OTHER): Payer: PRIVATE HEALTH INSURANCE | Admitting: Family Medicine

## 2015-02-15 ENCOUNTER — Encounter: Payer: Self-pay | Admitting: Family Medicine

## 2015-02-15 VITALS — BP 136/78 | HR 98 | Temp 99.8°F | Wt 243.0 lb

## 2015-02-15 DIAGNOSIS — F329 Major depressive disorder, single episode, unspecified: Secondary | ICD-10-CM

## 2015-02-15 DIAGNOSIS — Z720 Tobacco use: Secondary | ICD-10-CM

## 2015-02-15 DIAGNOSIS — Z8632 Personal history of gestational diabetes: Secondary | ICD-10-CM | POA: Insufficient documentation

## 2015-02-15 DIAGNOSIS — F32A Depression, unspecified: Secondary | ICD-10-CM

## 2015-02-15 DIAGNOSIS — F909 Attention-deficit hyperactivity disorder, unspecified type: Secondary | ICD-10-CM

## 2015-02-15 DIAGNOSIS — F988 Other specified behavioral and emotional disorders with onset usually occurring in childhood and adolescence: Secondary | ICD-10-CM

## 2015-02-15 LAB — OB RESULTS CONSOLE GBS: STREP GROUP B AG: POSITIVE

## 2015-02-15 MED ORDER — LISDEXAMFETAMINE DIMESYLATE 30 MG PO CAPS: 30.0000 mg | ORAL_CAPSULE | Freq: Every day | ORAL | Status: DC

## 2015-02-15 MED ORDER — SERTRALINE HCL 100 MG PO TABS: 100.0000 mg | ORAL_TABLET | Freq: Every day | ORAL | Status: DC

## 2015-02-15 NOTE — Progress Notes (Signed)
Tana Conch, MD  Subjective:  Sara Reid is a 29 y.o. year old very pleasant female patient who presents with:  See problem oriented charting ROS-no palpitations, chest pain, shortness of breath. No SI/HI.   Past Medical History- PCOS, gestational diabetes  Medications- reviewed and updated Current Outpatient Prescriptions  Medication Sig Dispense Refill  . lisdexamfetamine (VYVANSE) 30 MG capsule Take 1 capsule (30 mg total) by mouth daily. Lower dose as pregnant. May fill 01/24/15 30 capsule 0  . sertraline (ZOLOFT) 100 MG tablet Take 1 tablet (100 mg total) by mouth daily. 90 tablet 1    Objective: BP 136/78 mmHg  Pulse 98  Temp(Src) 99.8 F (37.7 C)  Wt 243 lb (110.224 kg) Gen: NAD, resting comfortably CV: RRR no murmurs rubs or gallops Lungs: CTAB no crackles, wheeze, rhonchi Abdomen: soft/nontender/nondistended/normal bowel sounds. No rebound or guarding. No fundal tenderness Ext: trace edema Skin: warm, dry, no rash Neuro: grossly normal, moves all extremities   Assessment/Plan:  Attention deficit disorder S:ADD remains reasonably controlled on very low dose vyvanse at .  [redacted] weeks pregnant next Tuesday. Patient has not sought psych care as advised. 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 She is not planning on going back to work but they may work on Land. She has talked to gyn and Shirlean Kelly was to be off medication and build up milk stores once she had the child. We reviewed milk production may be decreased by vyvanse. A/P: continue current rx. Continued counseling on benefits/risks of medicatoins.    Tobacco abuse S:Had been on 1 cigarette per day and advised cessation. 1 a week.  A/P: advised complete and absolute cessation as pregnant   Depression S: controlled on zoloft  A/P: continue current rx, refilled   3  month f/u. High risk patient due to pregnancy.    Meds ordered this encounter  Medications  .  lisdexamfetamine (VYVANSE) 30 MG capsule    Sig: Take 1 capsule (30 mg total) by mouth daily. Lower dose as pregnant. May fill 02/24/15    Dispense:  30 capsule    Refill:  0  .  lisdexamfetamine (VYVANSE) 30 MG capsule    Sig: Take 1 capsule (30 mg total) by mouth daily. Lower dose as pregnant. May fill 03/27/15    Dispense:  30 capsule    Refill:  0  . lisdexamfetamine (VYVANSE) 30 MG capsule    Sig: Take 1 capsule (30 mg total) by mouth daily. Lower dose as pregnant. May fill 04/26/15    Dispense:  30 capsule    Refill:  0  . sertraline (ZOLOFT) 100 MG tablet    Sig: Take 1 tablet (100 mg total) by mouth daily.    Dispense:  90 tablet    Refill:  1

## 2015-02-15 NOTE — Patient Instructions (Signed)
I think you have a good plan- trial off medicine whenever you stop working, try to build breast milk stores as much as possible, may resume medication if symptoms not tolerable/conducive to caring for child  I would also discuss case with child's pediatrician. Possible they may recommend bottle feeding if have to be on vyvanse  Encourage complete smoking cessation/quitting  Follow up 3 months  From Up to date:  Pregnancy Implications  Adverse effects have not been observed in animal reproduction studies. Lisdexamfetamine is converted to dextroamphetamine. The majority of human data is based on illicit amphetamine/methamphetamine exposure and not from therapeutic maternal use Justice [place] 2005). Use of amphetamines during pregnancy may lead to an increased risk of premature birth and low birth weight; newborns may experience symptoms of withdrawal. Behavioral problems may also occur later in childhood Dudley Major 2012).   Breast-Feeding Considerations  The majority of human data is based on illicit amphetamine/methamphetamine exposure and not from therapeutic maternal use Justice [place] 2005). Amphetamines are excreted into breast milk and use may decrease milk production. Increased irritability, agitation, and crying have been reported in nursing infants (ACOG 2011). According to the manufacturer, the decision to continue or discontinue breast-feeding during therapy should take into account the risk of exposure to the infant and the benefits of treatment to the mother.

## 2015-02-15 NOTE — Assessment & Plan Note (Signed)
S:ADD remains reasonably controlled on very low dose vyvanse at .  [redacted] weeks pregnant next Tuesday. Patient has not sought psych care as advised. 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 She is not planning on going back to work but they may work on Land. She has talked to gyn and Shirlean Kelly was to be off medication and build up milk stores once she had the child. We reviewed milk production may be decreased by vyvanse. A/P: continue current rx. Continued counseling on benefits/risks of medicatoins.

## 2015-02-15 NOTE — Assessment & Plan Note (Signed)
S:Had been on 1 cigarette per day and advised cessation. 1 a week.  A/P: advised complete and absolute cessation as pregnant

## 2015-02-15 NOTE — Assessment & Plan Note (Signed)
S: controlled on zoloft  A/P: continue current rx, refilled

## 2015-02-16 ENCOUNTER — Inpatient Hospital Stay (HOSPITAL_COMMUNITY)
Admission: AD | Admit: 2015-02-16 | Discharge: 2015-02-19 | DRG: 781 | Disposition: A | Discharge status: Home / Self Care | Payer: 59 | Source: Ambulatory Visit | Attending: Obstetrics and Gynecology | Admitting: Obstetrics and Gynecology

## 2015-02-16 ENCOUNTER — Encounter (HOSPITAL_COMMUNITY): Payer: Self-pay | Admitting: *Deleted

## 2015-02-16 ENCOUNTER — Inpatient Hospital Stay (HOSPITAL_COMMUNITY): Payer: 59

## 2015-02-16 DIAGNOSIS — O403XX0 Polyhydramnios, third trimester, not applicable or unspecified: Secondary | ICD-10-CM | POA: Diagnosis present

## 2015-02-16 DIAGNOSIS — F329 Major depressive disorder, single episode, unspecified: Secondary | ICD-10-CM | POA: Diagnosis present

## 2015-02-16 DIAGNOSIS — R52 Pain, unspecified: Secondary | ICD-10-CM

## 2015-02-16 DIAGNOSIS — R1110 Vomiting, unspecified: Secondary | ICD-10-CM

## 2015-02-16 DIAGNOSIS — O99333 Smoking (tobacco) complicating pregnancy, third trimester: Secondary | ICD-10-CM | POA: Diagnosis present

## 2015-02-16 DIAGNOSIS — O3483 Maternal care for other abnormalities of pelvic organs, third trimester: Secondary | ICD-10-CM | POA: Diagnosis present

## 2015-02-16 DIAGNOSIS — O26839 Pregnancy related renal disease, unspecified trimester: Secondary | ICD-10-CM

## 2015-02-16 DIAGNOSIS — E282 Polycystic ovarian syndrome: Secondary | ICD-10-CM | POA: Diagnosis present

## 2015-02-16 DIAGNOSIS — O99343 Other mental disorders complicating pregnancy, third trimester: Secondary | ICD-10-CM | POA: Diagnosis present

## 2015-02-16 DIAGNOSIS — Z3A35 35 weeks gestation of pregnancy: Secondary | ICD-10-CM

## 2015-02-16 DIAGNOSIS — O24410 Gestational diabetes mellitus in pregnancy, diet controlled: Secondary | ICD-10-CM

## 2015-02-16 DIAGNOSIS — O139 Gestational [pregnancy-induced] hypertension without significant proteinuria, unspecified trimester: Secondary | ICD-10-CM

## 2015-02-16 DIAGNOSIS — O133 Gestational [pregnancy-induced] hypertension without significant proteinuria, third trimester: Principal | ICD-10-CM | POA: Diagnosis present

## 2015-02-16 DIAGNOSIS — O3663X0 Maternal care for excessive fetal growth, third trimester, not applicable or unspecified: Secondary | ICD-10-CM | POA: Diagnosis present

## 2015-02-16 DIAGNOSIS — R1010 Upper abdominal pain, unspecified: Secondary | ICD-10-CM | POA: Diagnosis present

## 2015-02-16 DIAGNOSIS — O26833 Pregnancy related renal disease, third trimester: Secondary | ICD-10-CM

## 2015-02-16 DIAGNOSIS — N200 Calculus of kidney: Secondary | ICD-10-CM

## 2015-02-16 DIAGNOSIS — O26899 Other specified pregnancy related conditions, unspecified trimester: Secondary | ICD-10-CM

## 2015-02-16 DIAGNOSIS — O99891 Other specified diseases and conditions complicating pregnancy: Secondary | ICD-10-CM

## 2015-02-16 DIAGNOSIS — R109 Unspecified abdominal pain: Secondary | ICD-10-CM

## 2015-02-16 DIAGNOSIS — R079 Chest pain, unspecified: Secondary | ICD-10-CM

## 2015-02-16 HISTORY — DX: Calculus of kidney: O26.839

## 2015-02-16 HISTORY — DX: Calculus of kidney: N20.0

## 2015-02-16 LAB — URINE MICROSCOPIC-ADD ON

## 2015-02-16 LAB — COMPREHENSIVE METABOLIC PANEL
ALT: 16 U/L (ref 14–54)
AST: 17 U/L (ref 15–41)
Albumin: 3.2 g/dL — ABNORMAL LOW (ref 3.5–5.0)
Alkaline Phosphatase: 114 U/L (ref 38–126)
Anion gap: 11 (ref 5–15)
BUN: 6 mg/dL (ref 6–20)
CO2: 20 mmol/L — ABNORMAL LOW (ref 22–32)
Calcium: 8.7 mg/dL — ABNORMAL LOW (ref 8.9–10.3)
Chloride: 105 mmol/L (ref 101–111)
Creatinine, Ser: 0.55 mg/dL (ref 0.44–1.00)
GFR calc Af Amer: >60 mL/min (ref >60)
GFR calc non Af Amer: >60 mL/min (ref >60)
Glucose, Bld: 119 mg/dL — ABNORMAL HIGH (ref 65–99)
Potassium: 3.3 mmol/L — ABNORMAL LOW (ref 3.5–5.1)
Sodium: 136 mmol/L (ref 135–145)
Total Bilirubin: 0.4 mg/dL (ref 0.3–1.2)
Total Protein: 6.9 g/dL (ref 6.5–8.1)

## 2015-02-16 LAB — URINALYSIS, ROUTINE W REFLEX MICROSCOPIC
BILIRUBIN URINE: NEGATIVE
Glucose, UA: NEGATIVE mg/dL
KETONES UR: NEGATIVE mg/dL
Leukocytes, UA: NEGATIVE
Nitrite: NEGATIVE
PROTEIN: NEGATIVE mg/dL
Specific Gravity, Urine: 1.01 (ref 1.005–1.030)
Urobilinogen, UA: 0.2 mg/dL (ref 0.0–1.0)
pH: 7 (ref 5.0–8.0)

## 2015-02-16 LAB — CBC WITH DIFFERENTIAL/PLATELET
Basophils Absolute: 0 10*3/uL (ref 0.0–0.1)
Basophils Relative: 0 % (ref 0–1)
Eosinophils Absolute: 0 10*3/uL (ref 0.0–0.7)
Eosinophils Relative: 0 % (ref 0–5)
HCT: 30.9 % — ABNORMAL LOW (ref 36.0–46.0)
Hemoglobin: 10.7 g/dL — ABNORMAL LOW (ref 12.0–15.0)
Lymphocytes Relative: 11 % — ABNORMAL LOW (ref 12–46)
Lymphs Abs: 1.3 10*3/uL (ref 0.7–4.0)
MCH: 30.9 pg (ref 26.0–34.0)
MCHC: 34.6 g/dL (ref 30.0–36.0)
MCV: 89.3 fL (ref 78.0–100.0)
Monocytes Absolute: 0.8 10*3/uL (ref 0.1–1.0)
Monocytes Relative: 7 % (ref 3–12)
Neutro Abs: 9.5 10*3/uL — ABNORMAL HIGH (ref 1.7–7.7)
Neutrophils Relative %: 82 % — ABNORMAL HIGH (ref 43–77)
Platelets: 216 10*3/uL (ref 150–400)
RBC: 3.46 MIL/uL — ABNORMAL LOW (ref 3.87–5.11)
RDW: 13.3 % (ref 11.5–15.5)
WBC: 11.6 10*3/uL — ABNORMAL HIGH (ref 4.0–10.5)

## 2015-02-16 LAB — PROTEIN / CREATININE RATIO, URINE
Creatinine, Urine: 75 mg/dL
Protein Creatinine Ratio: 0.13 mg/mg{Cre} (ref 0.00–0.15)
Total Protein, Urine: 10 mg/dL

## 2015-02-16 MED ORDER — LACTATED RINGERS IV SOLN
INTRAVENOUS | Status: DC
  Administered 2015-02-17 – 2015-02-18 (×3): via INTRAVENOUS

## 2015-02-16 MED ORDER — BUTORPHANOL TARTRATE 1 MG/ML IJ SOLN
2.0000 mg | Freq: Once | INTRAMUSCULAR | Status: AC
  Administered 2015-02-16: 2 mg via INTRAVENOUS
  Filled 2015-02-16: qty 2

## 2015-02-16 MED ORDER — BUTORPHANOL TARTRATE 1 MG/ML IJ SOLN
1.0000 mg | INTRAMUSCULAR | Status: DC | PRN
  Administered 2015-02-17 – 2015-02-19 (×16): 1 mg via INTRAVENOUS
  Filled 2015-02-16 (×17): qty 1

## 2015-02-16 MED ORDER — DOCUSATE SODIUM 100 MG PO CAPS
100.0000 mg | ORAL_CAPSULE | Freq: Every day | ORAL | Status: DC
  Administered 2015-02-18 – 2015-02-19 (×2): 100 mg via ORAL
  Filled 2015-02-16 (×3): qty 1

## 2015-02-16 MED ORDER — PRENATAL MULTIVITAMIN CH
1.0000 | ORAL_TABLET | Freq: Every day | ORAL | Status: DC
  Administered 2015-02-18: 1 via ORAL
  Filled 2015-02-16 (×2): qty 1

## 2015-02-16 MED ORDER — LACTATED RINGERS IV SOLN
INTRAVENOUS | Status: DC
  Administered 2015-02-16: 125 mL/h via INTRAVENOUS

## 2015-02-16 MED ORDER — ACETAMINOPHEN 325 MG PO TABS: 650.0000 mg | ORAL_TABLET | ORAL | Status: DC | PRN

## 2015-02-16 MED ORDER — CALCIUM CARBONATE ANTACID 500 MG PO CHEW
2.0000 | CHEWABLE_TABLET | ORAL | Status: DC | PRN
  Administered 2015-02-18 (×2): 400 mg via ORAL
  Filled 2015-02-16 (×2): qty 2

## 2015-02-16 MED ORDER — PROMETHAZINE HCL 25 MG/ML IJ SOLN
12.5000 mg | Freq: Once | INTRAMUSCULAR | Status: AC
  Administered 2015-02-16: 12.5 mg via INTRAVENOUS
  Filled 2015-02-16: qty 1

## 2015-02-16 MED ORDER — PROMETHAZINE HCL 25 MG/ML IJ SOLN
12.5000 mg | INTRAMUSCULAR | Status: DC | PRN
  Administered 2015-02-17 (×5): 12.5 mg via INTRAVENOUS
  Filled 2015-02-16 (×6): qty 1

## 2015-02-16 NOTE — MAU Provider Note (Signed)
History     CSN: [id number]  Arrival date and time: 02/16/15 1948   None     Chief Complaint  Patient presents with  . Nausea  . Flank Pain   HPI  Sara Reid 29 y.o. G1P0 @ [redacted]w[redacted]d presents to the MAU with moderate to severe left flank pain. She reports nausea and vomiting.  Her B/p's are elevated probably due to pain. Denies contractions, Lof vaginal bleeding.   Past Medical History  Diagnosis Date  . Depression   . Neonatal jaundice   . UTI (lower urinary tract infection)   . Gestational diabetes     Past Surgical History  Procedure Laterality Date  . Breast biopsy      benign    Family History  Problem Relation Age of Onset  . Hyperlipidemia Mother   . Depression Mother     anxiety  . Hyperlipidemia Father     Social History  Substance Use Topics  . Smoking status: Current Every Day Smoker -- 1.00 packs/day  . Smokeless tobacco: None  . Alcohol Use: No     Comment: yes outside pregnancy    Allergies:  Allergies  Allergen Reactions  . Amoxicillin-Pot Clavulanate Rash  . Ceclor [Cefaclor] Rash  . Eryzole Rash    Pediazole    Prescriptions prior to admission  Medication Sig Dispense Refill Last Dose  . acetaminophen (TYLENOL) 500 MG tablet Take 1,000 mg by mouth every 6 (six) hours as needed for mild pain or headache.   Past Week at Unknown time  . IRON PO Take 1 tablet by mouth daily.   02/16/2015 at [id number]  . lisdexamfetamine (VYVANSE) 30 MG capsule Take 1 capsule (30 mg total) by mouth daily. Lower dose as pregnant. May fill 04/26/15 30 capsule 0 02/16/2015 at Unknown time  . Prenatal Vit-Fe Fumarate-FA (PRENATAL MULTIVITAMIN) TABS tablet Take 1 tablet by mouth at bedtime.   02/15/2015 at Unknown time  . sertraline (ZOLOFT) 100 MG tablet Take 1 tablet (100 mg total) by mouth daily. 90 tablet 1 02/16/2015 at Unknown time    Review of Systems  Constitutional: Negative for fever.  Eyes: Negative for blurred vision.  Respiratory: Negative  for cough.   Cardiovascular: Negative for chest pain.  Gastrointestinal: Positive for nausea and vomiting.  Genitourinary: Negative for dysuria.  Musculoskeletal: Positive for back pain.       Left flank pain  Neurological: Negative for headaches.  All other systems reviewed and are negative.  Physical Exam   Blood pressure 154/96, pulse 101, temperature 98.5 F (36.9 C), resp. rate 18, height 5\' 10"  (1.778 m), weight 111.222 kg (245 lb 3.2 oz), SpO2 99 %. Results for orders placed or performed during the hospital encounter of 02/16/15 (from the past 24 hour(s))  Urinalysis, Routine w reflex microscopic (not at [facility])     Status: Abnormal   Collection Time: 02/16/15  8:07 PM  Result Value Ref Range   Color, Urine YELLOW YELLOW   APPearance CLEAR CLEAR   Specific Gravity, Urine 1.010 1.005 - 1.030   pH 7.0 5.0 - 8.0   Glucose, UA NEGATIVE NEGATIVE mg/dL   Hgb urine dipstick SMALL (A) NEGATIVE   Bilirubin Urine NEGATIVE NEGATIVE   Ketones, ur NEGATIVE NEGATIVE mg/dL   Protein, ur NEGATIVE NEGATIVE mg/dL   Urobilinogen, UA 0.2 0.0 - 1.0 mg/dL   Nitrite NEGATIVE NEGATIVE   Leukocytes, UA NEGATIVE NEGATIVE  Urine microscopic-add on     Status: Abnormal   Collection Time: 02/16/15  8:07 PM  Result Value Ref Range   Squamous Epithelial / LPF RARE RARE   WBC, UA 0-2 <3 WBC/hpf   RBC / HPF 3-6 <3 RBC/hpf   Bacteria, UA FEW (A) RARE   Casts GRANULAR CAST (A) NEGATIVE   Urine-Other AMORPHOUS URATES/PHOSPHATES    02/16/15 2230  --  71  --  --  146/90 mmHg  --  97 %  --  -- DS     02/16/15 2215  --  78  --  --  146/85 mmHg  --  96 %  --  -- DS    02/16/15 2200  --  74  --  --  152/90 mmHg  --  96 %  --  -- DS    02/16/15 2145  --  83  --  --  165/87 mmHg  --  98 %  --  -- DS    02/16/15 2130  --  79  --  --  150/92 mmHg  --  99 %  --  -- DS    02/16/15 2122  98.2 F (36.8 C)  --  --  --  --  --  --  --  -- DS    02/16/15 2115  --  72  --  --   164/148 mmHg  --  100 %  --  -- DS     02/16/15 2100  --  92  --  --  163/88 mmHg  --  100 %  --  -- DS    02/16/15 2050  --  101  --  --  --  --  99 %  --  -- DS    02/16/15 2047  --  92  --  --  --  --  100 %  --  -- DS    02/16/15 2046  --  89  --  --  154/96 mmHg  --  --  --  -- DS    02/16/15 2035  --  92  --  --   153/105 mmHg  --  --  --  -- DS    02/16/15 1959  --  93  --  --  165/96 mmHg  --  --  --  -- JB    02/16/15 1958  98.5 F (36.9 C)  --  --  18  --  --  --  --  111.222 kg (245 lb 3.2 oz) JB     Physical Exam  Nursing note and vitals reviewed. Constitutional: She is oriented to person, place, and time. She appears well-developed and well-nourished. She appears distressed.  HENT:  Head: Normocephalic and atraumatic.  Neck: Normal range of motion.  Cardiovascular: Normal rate.   Respiratory: Effort normal. No respiratory distress.  GI: Soft. There is no tenderness.  Musculoskeletal: Normal range of motion. She exhibits tenderness.  Left flank pain  Neurological: She is alert and oriented to person, place, and time.  Skin: Skin is warm and dry.  Psychiatric: She has a normal mood and affect. Her behavior is normal. Judgment and thought content normal.   US Renal  02/16/2015   CLINICAL DATA:  Left flank pain.  Thirty-five weeks pregnant.  EXAM: RENAL / URINARY TRACT ULTRASOUND COMPLETE  COMPARISON:  None.  FINDINGS: Right Kidney:  Length: 14.1 cm, within normal limits. Echogenicity within normal limits. No mass or hydronephrosis visualized.  Left Kidney:  Length: 14.0 cm, within normal limits. Echogenicity within normal limits. No mass or hydronephrosis visualized.  Bladder:  Appears normal for degree of bladder distention.  IMPRESSION: Negative bilateral renal ultrasound.   Electronically Signed   By: Marin Roberts M.D.   On: 02/16/2015 22:11   Results for orders placed or performed during the hospital encounter of 02/16/15 (from the past 24 hour(s))  Urinalysis, Routine w reflex microscopic (not  at [facility])     Status: Abnormal   Collection Time: 02/16/15  8:07 PM  Result Value Ref Range   Color, Urine YELLOW YELLOW   APPearance CLEAR CLEAR   Specific Gravity, Urine 1.010 1.005 - 1.030   pH 7.0 5.0 - 8.0   Glucose, UA NEGATIVE NEGATIVE mg/dL   Hgb urine dipstick SMALL (A) NEGATIVE   Bilirubin Urine NEGATIVE NEGATIVE   Ketones, ur NEGATIVE NEGATIVE mg/dL   Protein, ur NEGATIVE NEGATIVE mg/dL   Urobilinogen, UA 0.2 0.0 - 1.0 mg/dL   Nitrite NEGATIVE NEGATIVE   Leukocytes, UA NEGATIVE NEGATIVE  Urine microscopic-add on     Status: Abnormal   Collection Time: 02/16/15  8:07 PM  Result Value Ref Range   Squamous Epithelial / LPF RARE RARE   WBC, UA 0-2 <3 WBC/hpf   RBC / HPF 3-6 <3 RBC/hpf   Bacteria, UA FEW (A) RARE   Casts GRANULAR CAST (A) NEGATIVE   Urine-Other AMORPHOUS URATES/PHOSPHATES   Protein / creatinine ratio, urine     Status: None   Collection Time: 02/16/15  8:07 PM  Result Value Ref Range   Creatinine, Urine 75.00 mg/dL   Total Protein, Urine 10 mg/dL   Protein Creatinine Ratio 0.13 0.00 - 0.15 mg/mg[Cre]  CBC with Differential/Platelet     Status: Abnormal   Collection Time: 02/16/15  9:30 PM  Result Value Ref Range   WBC 11.6 (H) 4.0 - 10.5 K/uL   RBC 3.46 (L) 3.87 - 5.11 MIL/uL   Hemoglobin 10.7 (L) 12.0 - 15.0 g/dL   HCT 16.1 (L) 09.6 - 04.5 %   MCV 89.3 78.0 - 100.0 fL   MCH 30.9 26.0 - 34.0 pg   MCHC 34.6 30.0 - 36.0 g/dL   RDW 40.9 81.1 - 91.4 %   Platelets 216 150 - 400 K/uL   Neutrophils Relative % 82 (H) 43 - 77 %   Neutro Abs 9.5 (H) 1.7 - 7.7 K/uL   Lymphocytes Relative 11 (L) 12 - 46 %   Lymphs Abs 1.3 0.7 - 4.0 K/uL   Monocytes Relative 7 3 - 12 %   Monocytes Absolute 0.8 0.1 - 1.0 K/uL   Eosinophils Relative 0 0 - 5 %   Eosinophils Absolute 0.0 0.0 - 0.7 K/uL   Basophils Relative 0 0 - 1 %   Basophils Absolute 0.0 0.0 - 0.1 K/uL  Comprehensive metabolic panel     Status: Abnormal   Collection Time: 02/16/15  9:30 PM  Result Value  Ref Range   Sodium 136 135 - 145 mmol/L   Potassium 3.3 (L) 3.5 - 5.1 mmol/L   Chloride 105 101 - 111 mmol/L   CO2 20 (L) 22 - 32 mmol/L   Glucose, Bld 119 (H) 65 - 99 mg/dL   BUN 6 6 - 20 mg/dL   Creatinine, Ser 7.82 0.44 - 1.00 mg/dL   Calcium 8.7 (L) 8.9 - 10.3 mg/dL   Total Protein 6.9 6.5 - 8.1 g/dL   Albumin 3.2 (L) 3.5 - 5.0 g/dL   AST 17 15 - 41 U/L   ALT 16 14 - 54 U/L   Alkaline Phosphatase  114 38 - 126 U/L   Total Bilirubin 0.4 0.3 - 1.2 mg/dL   GFR calc non Af Amer >60 >60 mL/min   GFR calc Af Amer >60 >60 mL/min   Anion gap 11 5 - 15    MAU Course  Procedures  MDM Spoke to Dr Su Hilt POC- IV hydration; pain med; antiemetic; PIH Labs and r/o kidney stone- U/S Cervix closed. Will admit to Ante to strain urine overnight. Spoke to [employer] and confirmed ok to admit.  Assessment and Plan  Possible Kidney Stone  Admit to Antepartum Unit  [facility] 02/16/2015, 9:04 PM

## 2015-02-16 NOTE — MAU Note (Signed)
L flank pain since 1100 and getting worse. Vomited tonight. Denies LOF or bleeding. When take a deep breath pain is sharp. Tender to touch.

## 2015-02-16 NOTE — MAU Note (Signed)
Pt has c/o left flank pain that began around noon today. Rates 8/10. Denies urinary s/s. Denies contractions, LOF or vag bleeding. +FM. Denies HA, vision changes.

## 2015-02-17 ENCOUNTER — Inpatient Hospital Stay (HOSPITAL_COMMUNITY): Payer: 59

## 2015-02-17 LAB — GLUCOSE, CAPILLARY: Glucose-Capillary: 103 mg/dL — ABNORMAL HIGH (ref 65–99)

## 2015-02-17 MED ORDER — FAMOTIDINE 20 MG PO TABS: 20.0000 mg | ORAL_TABLET | Freq: Every day | ORAL | Status: DC

## 2015-02-17 MED ORDER — MORPHINE SULFATE 4 MG/ML IJ SOLN
4.0000 mg | Freq: Once | INTRAMUSCULAR | Status: AC
  Administered 2015-02-17: 4 mg via INTRAVENOUS
  Filled 2015-02-17: qty 1

## 2015-02-17 MED ORDER — PANTOPRAZOLE SODIUM 40 MG IV SOLR
40.0000 mg | Freq: Every day | INTRAVENOUS | Status: DC
  Administered 2015-02-17: 40 mg via INTRAVENOUS
  Filled 2015-02-17 (×2): qty 40

## 2015-02-17 MED ORDER — SIMETHICONE 80 MG PO CHEW
80.0000 mg | CHEWABLE_TABLET | Freq: Four times a day (QID) | ORAL | Status: DC
  Administered 2015-02-17 – 2015-02-19 (×7): 80 mg via ORAL
  Filled 2015-02-17 (×7): qty 1

## 2015-02-17 MED ORDER — LABETALOL HCL 100 MG PO TABS
100.0000 mg | ORAL_TABLET | Freq: Two times a day (BID) | ORAL | Status: DC
  Administered 2015-02-17 – 2015-02-19 (×4): 100 mg via ORAL
  Filled 2015-02-17 (×4): qty 1

## 2015-02-17 MED ORDER — LABETALOL HCL 100 MG PO TABS: 100.0000 mg | ORAL_TABLET | Freq: Two times a day (BID) | ORAL | Status: DC

## 2015-02-17 NOTE — Progress Notes (Addendum)
Hospital day # 1 pregnancy at [redacted]w[redacted]d--Bilateral flank pain, mild hypertension  S:  Reports pain "moves"--previously in left flank, then to right flank, then between shoulder blades at present.  Mild nausea, no vomiting.  Taking po fluids in small amounts.  Denies fever, abdominal or epigastric pain, denies contractions, reports +FM.  Reports IV pain med and anti-emetic help her sleep, but she still feels the pain.  Colicky exacerbations noted.      Perception of contractions: None      Vaginal bleeding: None       Vaginal discharge:  None  O: BP 140/73 mmHg  Pulse 78  Temp(Src) 98.3 F (36.8 C) (Oral)  Resp 18  Ht 5\' 10"  (1.778 m)  Wt 111.222 kg (245 lb 3.2 oz)  BMI 35.18 kg/m2  SpO2 97%   BPs since MN:  146/86, 140/73        Fetal tracings:  Category 1--monitor belts were making patient more uncomfortable.  Now doing NST q 4 hours      Chest clear      Heart RRR without murmur      Abd gravid, NT, no rebound or guarding      + CVAT, but bilateral      Contractions:   None      Uterus non-tender      Extremities: edema 1+ and negative Homan's, 2+ DTR.   OB US today:  EFW 7+14, >90%ile, AFI 29.23, >97%ile, vtx, anterior placenta.  Renal US:  Negative 02/16/15          Labs:  Results for orders placed or performed during the hospital encounter of 02/16/15 (from the past 24 hour(s))  Urinalysis, Routine w reflex microscopic (not at [facility])     Status: Abnormal   Collection Time: 02/16/15  8:07 PM  Result Value Ref Range   Color, Urine YELLOW YELLOW   APPearance CLEAR CLEAR   Specific Gravity, Urine 1.010 1.005 - 1.030   pH 7.0 5.0 - 8.0   Glucose, UA NEGATIVE NEGATIVE mg/dL   Hgb urine dipstick SMALL (A) NEGATIVE   Bilirubin Urine NEGATIVE NEGATIVE   Ketones, ur NEGATIVE NEGATIVE mg/dL   Protein, ur NEGATIVE NEGATIVE mg/dL   Urobilinogen, UA 0.2 0.0 - 1.0 mg/dL   Nitrite NEGATIVE NEGATIVE   Leukocytes, UA NEGATIVE NEGATIVE  Urine microscopic-add on     Status: Abnormal   Collection Time: 02/16/15  8:07 PM  Result Value Ref Range   Squamous Epithelial / LPF RARE RARE   WBC, UA 0-2 <3 WBC/hpf   RBC / HPF 3-6 <3 RBC/hpf   Bacteria, UA FEW (A) RARE   Casts GRANULAR CAST (A) NEGATIVE   Urine-Other AMORPHOUS URATES/PHOSPHATES   Protein / creatinine ratio, urine     Status: None   Collection Time: 02/16/15  8:07 PM  Result Value Ref Range   Creatinine, Urine 75.00 mg/dL   Total Protein, Urine 10 mg/dL   Protein Creatinine Ratio 0.13 0.00 - 0.15 mg/mg[Cre]  CBC with Differential/Platelet     Status: Abnormal   Collection Time: 02/16/15  9:30 PM  Result Value Ref Range   WBC 11.6 (H) 4.0 - 10.5 K/uL   RBC 3.46 (L) 3.87 - 5.11 MIL/uL   Hemoglobin 10.7 (L) 12.0 - 15.0 g/dL   HCT 96.7 (L) 89.3 - 81.0 %   MCV 89.3 78.0 - 100.0 fL   MCH 30.9 26.0 - 34.0 pg   MCHC 34.6 30.0 - 36.0 g/dL   RDW 17.5 10.2 -  15.5 %   Platelets 216 150 - 400 K/uL   Neutrophils Relative % 82 (H) 43 - 77 %   Neutro Abs 9.5 (H) 1.7 - 7.7 K/uL   Lymphocytes Relative 11 (L) 12 - 46 %   Lymphs Abs 1.3 0.7 - 4.0 K/uL   Monocytes Relative 7 3 - 12 %   Monocytes Absolute 0.8 0.1 - 1.0 K/uL   Eosinophils Relative 0 0 - 5 %   Eosinophils Absolute 0.0 0.0 - 0.7 K/uL   Basophils Relative 0 0 - 1 %   Basophils Absolute 0.0 0.0 - 0.1 K/uL  Comprehensive metabolic panel     Status: Abnormal   Collection Time: 02/16/15  9:30 PM  Result Value Ref Range   Sodium 136 135 - 145 mmol/L   Potassium 3.3 (L) 3.5 - 5.1 mmol/L   Chloride 105 101 - 111 mmol/L   CO2 20 (L) 22 - 32 mmol/L   Glucose, Bld 119 (H) 65 - 99 mg/dL   BUN 6 6 - 20 mg/dL   Creatinine, Ser 1.19 0.44 - 1.00 mg/dL   Calcium 8.7 (L) 8.9 - 10.3 mg/dL   Total Protein 6.9 6.5 - 8.1 g/dL   Albumin 3.2 (L) 3.5 - 5.0 g/dL   AST 17 15 - 41 U/L   ALT 16 14 - 54 U/L   Alkaline Phosphatase 114 38 - 126 U/L   Total Bilirubin 0.4 0.3 - 1.2 mg/dL   GFR calc non Af Amer >60 >60 mL/min   GFR calc Af Amer >60 >60 mL/min   Anion gap 11 5 -  15  Type and screen     Status: None (Preliminary result)   Collection Time: 02/16/15  9:30 PM  Result Value Ref Range   ABO/RH(D) A NEG    Antibody Screen POS    Sample Expiration 02/19/2015    Antibody Identification PASSIVELY ACQUIRED ANTI-D    DAT, IgG NEG    Unit Number [id number]    Blood Component Type RED CELLS,LR    Unit division 00    Status of Unit ALLOCATED    Transfusion Status OK TO TRANSFUSE    Crossmatch Result COMPATIBLE    Unit Number [id number]    Blood Component Type RED CELLS,LR    Unit division 00    Status of Unit ALLOCATED    Transfusion Status OK TO TRANSFUSE    Crossmatch Result COMPATIBLE          Meds:  Stadol 1 mg prn--received at 0041, 0345, 0732, 1027 Phenergan 12.5 mg prn--received at 0158, 0559, 0949 Morphine 4 mg IV x 1 at 0502 Stadol 2 mg IV x 1 at 2143   A: [redacted]w[redacted]d with colicky back and flank pain--unclear etiology     Unchanged status     Mild elevation of BP--no evidence pre-eclampsia     Polyhydramnios     LGA fetus     Gestational diabetes, diet controlled.  P: Continue current plan of care      Upcoming tests/treatments:  NST q 4 hours      Continue to observe BP      Continue pain med as needed.      MDs will follow--Dr. Sallye Ober to see today.      CBGs FBS and 2 hour pp.      BP q 2 hours while awake, q 4 hrs during night.  Nigel Bridgeman CNM, [place] 02/17/2015 12:29 PM  Addendum: Patient now complaining of upper abdominal pain, and  vomited bile stained fluid. Consulted with Dr. Girtha Rm obtain abdominal US for evaluation of gallbladder.  Nigel Bridgeman, CNM 02/17/15 1:15p  I saw and examined patient at bedside and agree with above findings, assessment and plan.  On my evaluation patient complaining of pain in upper abdomen, mid section, but also  bilaterally, radiates to back, pain is migrating.  Reports pain is constant with intermittent worsening.  It improves with stadol but comes back again.  With increased burping.  Also with chest discomfort on taking a deep breath.  We reviewed imaging being significant for LGA fetus with polyhydramnios which could explain the chest discomfort symptoms.  We reviewed normal pulse and oxygen saturation. If worsening chest complaints will do chest imaging.  We reviewed normal renal and upper abdominal ultrasounds.  Will give protonix and simethicone for possible reflux and gas pain.  Patient also with gestational hypertension, continue with close monitoring of BPs and start antihypertensive if sustained elevated BPs in severe range.  Dr. Sallye Ober.

## 2015-02-17 NOTE — Progress Notes (Signed)
Antepartum LOS: 1 Sara Reid, 29 y.o.,   OB History    Gravida Para Term Preterm AB TAB SAB Ectopic Multiple Living   1               Subjective -Patient resting in bed.  Reports pain is better, but regards this to being "halfway through stadol dose."   Patient reports good fetal movement and denies LoF, contractions, and VB.   Objective  Filed Vitals:   02/16/15 2215 02/16/15 2230 02/16/15 2245 02/17/15 0008  BP: 146/85 146/90 157/92 146/86  Pulse: 78 71 76 76  Temp:      TempSrc:      Resp:      Height:      Weight:      SpO2: 96% 97% 97%     Results for orders placed or performed during the hospital encounter of 02/16/15 (from the past 24 hour(s))  Urinalysis, Routine w reflex microscopic (not at [facility])     Status: Abnormal   Collection Time: 02/16/15  8:07 PM  Result Value Ref Range   Color, Urine YELLOW YELLOW   APPearance CLEAR CLEAR   Specific Gravity, Urine 1.010 1.005 - 1.030   pH 7.0 5.0 - 8.0   Glucose, UA NEGATIVE NEGATIVE mg/dL   Hgb urine dipstick SMALL (A) NEGATIVE   Bilirubin Urine NEGATIVE NEGATIVE   Ketones, ur NEGATIVE NEGATIVE mg/dL   Protein, ur NEGATIVE NEGATIVE mg/dL   Urobilinogen, UA 0.2 0.0 - 1.0 mg/dL   Nitrite NEGATIVE NEGATIVE   Leukocytes, UA NEGATIVE NEGATIVE  Urine microscopic-add on     Status: Abnormal   Collection Time: 02/16/15  8:07 PM  Result Value Ref Range   Squamous Epithelial / LPF RARE RARE   WBC, UA 0-2 <3 WBC/hpf   RBC / HPF 3-6 <3 RBC/hpf   Bacteria, UA FEW (A) RARE   Casts GRANULAR CAST (A) NEGATIVE   Urine-Other AMORPHOUS URATES/PHOSPHATES   Protein / creatinine ratio, urine     Status: None   Collection Time: 02/16/15  8:07 PM  Result Value Ref Range   Creatinine, Urine 75.00 mg/dL   Total Protein, Urine 10 mg/dL   Protein Creatinine Ratio 0.13 0.00 - 0.15 mg/mg[Cre]  CBC with Differential/Platelet     Status: Abnormal   Collection Time: 02/16/15  9:30 PM  Result Value Ref Range   WBC 11.6 (H) 4.0  - 10.5 K/uL   RBC 3.46 (L) 3.87 - 5.11 MIL/uL   Hemoglobin 10.7 (L) 12.0 - 15.0 g/dL   HCT 69.6 (L) 29.5 - 28.4 %   MCV 89.3 78.0 - 100.0 fL   MCH 30.9 26.0 - 34.0 pg   MCHC 34.6 30.0 - 36.0 g/dL   RDW 13.2 44.0 - 10.2 %   Platelets 216 150 - 400 K/uL   Neutrophils Relative % 82 (H) 43 - 77 %   Neutro Abs 9.5 (H) 1.7 - 7.7 K/uL   Lymphocytes Relative 11 (L) 12 - 46 %   Lymphs Abs 1.3 0.7 - 4.0 K/uL   Monocytes Relative 7 3 - 12 %   Monocytes Absolute 0.8 0.1 - 1.0 K/uL   Eosinophils Relative 0 0 - 5 %   Eosinophils Absolute 0.0 0.0 - 0.7 K/uL   Basophils Relative 0 0 - 1 %   Basophils Absolute 0.0 0.0 - 0.1 K/uL  Comprehensive metabolic panel     Status: Abnormal   Collection Time: 02/16/15  9:30 PM  Result Value Ref Range  Sodium 136 135 - 145 mmol/L   Potassium 3.3 (L) 3.5 - 5.1 mmol/L   Chloride 105 101 - 111 mmol/L   CO2 20 (L) 22 - 32 mmol/L   Glucose, Bld 119 (H) 65 - 99 mg/dL   BUN 6 6 - 20 mg/dL   Creatinine, Ser 4.54 0.44 - 1.00 mg/dL   Calcium 8.7 (L) 8.9 - 10.3 mg/dL   Total Protein 6.9 6.5 - 8.1 g/dL   Albumin 3.2 (L) 3.5 - 5.0 g/dL   AST 17 15 - 41 U/L   ALT 16 14 - 54 U/L   Alkaline Phosphatase 114 38 - 126 U/L   Total Bilirubin 0.4 0.3 - 1.2 mg/dL   GFR calc non Af Amer >60 >60 mL/min   GFR calc Af Amer >60 >60 mL/min   Anion gap 11 5 - 15  Type and screen     Status: None (Preliminary result)   Collection Time: 02/16/15  9:30 PM  Result Value Ref Range   ABO/RH(D) A NEG    Antibody Screen POS    Sample Expiration 02/19/2015    Antibody Identification PENDING    DAT, IgG PENDING     Meds: Scheduled Meds: . docusate sodium  100 mg Oral Daily  . prenatal multivitamin  1 tablet Oral Q1200   Continuous Infusions: . lactated ringers 125 mL/hr (02/16/15 2134)  . lactated ringers 125 mL/hr at 02/17/15 0041   PRN Meds:.acetaminophen, butorphanol, calcium carbonate, promethazine   Physical Exam  Constitutional: She is oriented to person, place, and  time. She appears well-developed and well-nourished. No distress.  HENT:  Head: Normocephalic and atraumatic.  Eyes: EOM are normal. Pupils are equal, round, and reactive to light.  Neck: Normal range of motion.  Cardiovascular: Normal rate, regular rhythm and normal heart sounds.   Pulmonary/Chest: Effort normal and breath sounds normal.  Abdominal: Soft. Bowel sounds are normal.  Gravid--fundal height appears AGA, Soft, NT  Neurological: She is alert and oriented to person, place, and time.  Skin: Skin is warm and dry. She is not diaphoretic.  Vitals reviewed. :   Monitoring Type:Continuous Time:0100 FHR: 115 bpm, Mod Var, -Decels, +10x10Accels UC: None graphed or palpated  Assessment IUP at [redacted]w[redacted]d Cat I FT Flank Pain Elevated BP  Plan Continue current plan of care Upcoming Treatments/Tests: Urine Culture, Strain Urine Continue stadol for pain Apply Pulse ox  Continue to monitor Dr. Alinda Sierras updated on patient status  Kara Mierzejewski, Joyice Faster, MSN, CNM 02/17/2015, 1:31 AM

## 2015-02-18 ENCOUNTER — Inpatient Hospital Stay (HOSPITAL_COMMUNITY): Payer: 59

## 2015-02-18 LAB — LIPASE, BLOOD: LIPASE: 36 U/L (ref 22–51)

## 2015-02-18 LAB — URIC ACID: URIC ACID, SERUM: 5 mg/dL (ref 2.3–6.6)

## 2015-02-18 LAB — GLUCOSE, CAPILLARY: Glucose-Capillary: 100 mg/dL — ABNORMAL HIGH (ref 65–99)

## 2015-02-18 LAB — CULTURE, OB URINE

## 2015-02-18 LAB — COMPREHENSIVE METABOLIC PANEL
ALK PHOS: 101 U/L (ref 38–126)
ALT: 17 U/L (ref 14–54)
ANION GAP: 10 (ref 5–15)
AST: 17 U/L (ref 15–41)
Albumin: 2.8 g/dL — ABNORMAL LOW (ref 3.5–5.0)
BILIRUBIN TOTAL: 0.9 mg/dL (ref 0.3–1.2)
BUN: 5 mg/dL — AB (ref 6–20)
CHLORIDE: 105 mmol/L (ref 101–111)
CO2: 22 mmol/L (ref 22–32)
CREATININE: 0.5 mg/dL (ref 0.44–1.00)
Calcium: 8.4 mg/dL — ABNORMAL LOW (ref 8.9–10.3)
GFR calc Af Amer: >60 mL/min (ref >60)
GFR calc non Af Amer: >60 mL/min (ref >60)
Glucose, Bld: 94 mg/dL (ref 65–99)
Potassium: 2.8 mmol/L — ABNORMAL LOW (ref 3.5–5.1)
Sodium: 137 mmol/L (ref 135–145)
TOTAL PROTEIN: 6.2 g/dL — AB (ref 6.5–8.1)

## 2015-02-18 LAB — CBC
HCT: 28.3 % — ABNORMAL LOW (ref 36.0–46.0)
Hemoglobin: 9.5 g/dL — ABNORMAL LOW (ref 12.0–15.0)
MCH: 30.6 pg (ref 26.0–34.0)
MCHC: 33.6 g/dL (ref 30.0–36.0)
MCV: 91.3 fL (ref 78.0–100.0)
PLATELETS: 187 10*3/uL (ref 150–400)
RBC: 3.1 MIL/uL — ABNORMAL LOW (ref 3.87–5.11)
RDW: 13.6 % (ref 11.5–15.5)
WBC: 11.9 10*3/uL — AB (ref 4.0–10.5)

## 2015-02-18 LAB — AMYLASE: AMYLASE: 178 U/L — AB (ref 28–100)

## 2015-02-18 LAB — LACTATE DEHYDROGENASE: LDH: 132 U/L (ref 98–192)

## 2015-02-18 MED ORDER — METOCLOPRAMIDE HCL 5 MG/ML IJ SOLN: 10.0000 mg | Freq: Three times a day (TID) | INTRAMUSCULAR | Status: DC

## 2015-02-18 MED ORDER — DIPHENHYDRAMINE HCL 50 MG/ML IJ SOLN: 12.5000 mg | Freq: Four times a day (QID) | INTRAMUSCULAR | Status: DC | PRN

## 2015-02-18 MED ORDER — LACTATED RINGERS IV SOLN
INTRAVENOUS | Status: DC
  Administered 2015-02-19: 20:00:00 via INTRAVENOUS

## 2015-02-18 MED ORDER — FENTANYL 10 MCG/ML IV SOLN
INTRAVENOUS | Status: DC
  Administered 2015-02-18: 12:00:00 via INTRAVENOUS
  Filled 2015-02-18: qty 50

## 2015-02-18 MED ORDER — ONDANSETRON HCL 4 MG/2ML IJ SOLN: 4.0000 mg | Freq: Four times a day (QID) | INTRAMUSCULAR | Status: DC | PRN

## 2015-02-18 MED ORDER — POTASSIUM CHLORIDE 10 MEQ/100ML IV SOLN
10.0000 meq | INTRAVENOUS | Status: AC
  Administered 2015-02-18 (×2): 10 meq via INTRAVENOUS
  Filled 2015-02-18 (×2): qty 100

## 2015-02-18 MED ORDER — DIPHENHYDRAMINE HCL 12.5 MG/5ML PO ELIX
12.5000 mg | ORAL_SOLUTION | Freq: Four times a day (QID) | ORAL | Status: DC | PRN
  Filled 2015-02-18: qty 5

## 2015-02-18 MED ORDER — PANTOPRAZOLE SODIUM 40 MG PO TBEC
40.0000 mg | DELAYED_RELEASE_TABLET | Freq: Every day | ORAL | Status: DC
  Administered 2015-02-18 – 2015-02-19 (×2): 40 mg via ORAL
  Filled 2015-02-18: qty 1

## 2015-02-18 MED ORDER — KCL-LACTATED RINGERS 20 MEQ/L IV SOLN
INTRAVENOUS | Status: AC
  Administered 2015-02-18 – 2015-02-19 (×3): via INTRAVENOUS
  Filled 2015-02-18 (×3): qty 1000

## 2015-02-18 MED ORDER — SODIUM CHLORIDE 0.9 % IJ SOLN: 9.0000 mL | INTRAMUSCULAR | Status: DC | PRN

## 2015-02-18 MED ORDER — METOCLOPRAMIDE HCL 5 MG/ML IJ SOLN: 10.0000 mg | Freq: Three times a day (TID) | INTRAMUSCULAR | Status: DC | PRN

## 2015-02-18 MED ORDER — METOCLOPRAMIDE HCL 5 MG/ML IJ SOLN
10.0000 mg | Freq: Three times a day (TID) | INTRAMUSCULAR | Status: AC
  Administered 2015-02-18 – 2015-02-19 (×3): 10 mg via INTRAVENOUS
  Filled 2015-02-18 (×3): qty 2

## 2015-02-18 MED ORDER — NALOXONE HCL 0.4 MG/ML IJ SOLN: 0.4000 mg | INTRAMUSCULAR | Status: DC | PRN

## 2015-02-18 NOTE — Progress Notes (Addendum)
Karlita Lichtman Lukes is a 29 y.o. G1P0 at [redacted]w[redacted]d admitted for flank pain  Hospital Day No: 2  Subjective: Reports N/V since admission however she is able to tolerate fluids this morning.  She also states the pain is oly mildly better and still needs the pain medicine q3hrs.  The patient says her pain is not really in her abdomen.  It is in her chest and it feels worse with deep breathing.  Objective: BP 120/69 mmHg  Pulse 89  Temp(Src) 99.1 F (37.3 C) (Oral)  Resp 18  Ht  (1.778 m)  Wt 111.222 kg (245 lb 3.2 oz)  BMI 35.18 kg/m2  SpO2 100% I/O last 3 completed shifts: In: -  Out: 700 [Urine:700]    Physical Exam:  Gen: appears uncomfortable Chest/Lungs: cta bilaterally  Heart/Pulse: RRR  Abdomen: soft, gravid, nontender Uterine fundus: soft, nontender Skin & Color: warm and dry  EXT: no calf tenderness  FHT:  Cat 1 UC:   none SVE:   Dilation: Closed Station: Ballotable Exam by:: L.Clemmons,CNM  Labs: Lab Results  Component Value Date   WBC 11.9* 02/18/2015   HGB 9.5* 02/18/2015   HCT 28.3* 02/18/2015   MCV 91.3 02/18/2015   PLT 187 02/18/2015    Assessment and Plan: has PCOS (polycystic ovarian syndrome); Depression; Attention deficit disorder; ACNE ROSACEA; Pruritic rash; Tobacco abuse; Gestational diabetes; and Kidney stone complicating pregnancy on her problem list.  GDM diet controlled GHTN under control now on  labetalol BID Chest xray Fetanyl PCA Reglan Recheck labs in am Potassium low - will replete Amylase is elevated.  Lipase wnl Jerremy Maione Y 02/18/2015, 10:36 AM   Pt liked the fentanyl but doesn't like all the monitoring.  Will d/c and go back to using stadol.

## 2015-02-19 ENCOUNTER — Inpatient Hospital Stay (HOSPITAL_COMMUNITY): Payer: 59

## 2015-02-19 DIAGNOSIS — Z3A35 35 weeks gestation of pregnancy: Secondary | ICD-10-CM | POA: Insufficient documentation

## 2015-02-19 DIAGNOSIS — R109 Unspecified abdominal pain: Secondary | ICD-10-CM

## 2015-02-19 DIAGNOSIS — O26899 Other specified pregnancy related conditions, unspecified trimester: Secondary | ICD-10-CM | POA: Insufficient documentation

## 2015-02-19 LAB — GLUCOSE, CAPILLARY
GLUCOSE-CAPILLARY: 85 mg/dL (ref 65–99)
Glucose-Capillary: 62 mg/dL — ABNORMAL LOW (ref 65–99)

## 2015-02-19 LAB — COMPREHENSIVE METABOLIC PANEL
ALBUMIN: 2.5 g/dL — AB (ref 3.5–5.0)
ALK PHOS: 119 U/L (ref 38–126)
ALT: 15 U/L (ref 14–54)
AST: 16 U/L (ref 15–41)
Anion gap: 9 (ref 5–15)
BILIRUBIN TOTAL: 1.6 mg/dL — AB (ref 0.3–1.2)
BUN: 6 mg/dL (ref 6–20)
CO2: 22 mmol/L (ref 22–32)
CREATININE: 0.52 mg/dL (ref 0.44–1.00)
Calcium: 8.7 mg/dL — ABNORMAL LOW (ref 8.9–10.3)
Chloride: 104 mmol/L (ref 101–111)
GFR calc Af Amer: >60 mL/min (ref >60)
GLUCOSE: 69 mg/dL (ref 65–99)
Potassium: 3.6 mmol/L (ref 3.5–5.1)
Sodium: 135 mmol/L (ref 135–145)
TOTAL PROTEIN: 5.4 g/dL — AB (ref 6.5–8.1)

## 2015-02-19 LAB — URINALYSIS, DIPSTICK ONLY
Bilirubin Urine: NEGATIVE
GLUCOSE, UA: NEGATIVE mg/dL
Glucose, UA: NEGATIVE mg/dL
HGB URINE DIPSTICK: NEGATIVE
HGB URINE DIPSTICK: NEGATIVE
Ketones, ur: >80 mg/dL — AB
Ketones, ur: 15 mg/dL — AB
LEUKOCYTES UA: NEGATIVE
Leukocytes, UA: NEGATIVE
NITRITE: NEGATIVE
Nitrite: NEGATIVE
PROTEIN: NEGATIVE mg/dL
Protein, ur: NEGATIVE mg/dL
SPECIFIC GRAVITY, URINE: 1.015 (ref 1.005–1.030)
UROBILINOGEN UA: 1 mg/dL (ref 0.0–1.0)
UROBILINOGEN UA: 1 mg/dL (ref 0.0–1.0)
pH: 7 (ref 5.0–8.0)
pH: 6.5 (ref 5.0–8.0)

## 2015-02-19 LAB — CBC
HEMATOCRIT: 27.8 % — AB (ref 36.0–46.0)
HEMOGLOBIN: 9.4 g/dL — AB (ref 12.0–15.0)
MCH: 30.9 pg (ref 26.0–34.0)
MCHC: 33.8 g/dL (ref 30.0–36.0)
MCV: 91.4 fL (ref 78.0–100.0)
Platelets: 194 10*3/uL (ref 150–400)
RBC: 3.04 MIL/uL — AB (ref 3.87–5.11)
RDW: 13.7 % (ref 11.5–15.5)
WBC: 10.3 10*3/uL (ref 4.0–10.5)

## 2015-02-19 LAB — LIPASE, BLOOD: Lipase: 20 U/L — ABNORMAL LOW (ref 22–51)

## 2015-02-19 LAB — AMYLASE: AMYLASE: 121 U/L — AB (ref 28–100)

## 2015-02-19 LAB — PROTEIN / CREATININE RATIO, URINE
Creatinine, Urine: 154 mg/dL
PROTEIN CREATININE RATIO: 0.22 mg/mg{creat} — AB (ref 0.00–0.15)
TOTAL PROTEIN, URINE: 34 mg/dL

## 2015-02-19 MED ORDER — ONDANSETRON HCL 4 MG PO TABS
4.0000 mg | ORAL_TABLET | Freq: Once | ORAL | Status: AC
  Administered 2015-02-19: 4 mg via ORAL
  Filled 2015-02-19: qty 1

## 2015-02-19 MED ORDER — OXYCODONE-ACETAMINOPHEN 5-325 MG PO TABS: 1.0000 | ORAL_TABLET | ORAL | Status: DC | PRN

## 2015-02-19 MED ORDER — ONDANSETRON HCL 4 MG PO TABS: 4.0000 mg | ORAL_TABLET | Freq: Once | ORAL | Status: DC

## 2015-02-19 MED ORDER — OXYCODONE-ACETAMINOPHEN 5-325 MG PO TABS
1.0000 | ORAL_TABLET | ORAL | Status: DC | PRN
  Administered 2015-02-19: 2 via ORAL
  Administered 2015-02-19: 1 via ORAL
  Filled 2015-02-19: qty 1
  Filled 2015-02-19: qty 2

## 2015-02-19 MED ORDER — LACTATED RINGERS IV BOLUS (SEPSIS)
1000.0000 mL | Freq: Once | INTRAVENOUS | Status: AC
  Administered 2015-02-19: 1000 mL via INTRAVENOUS

## 2015-02-19 NOTE — Progress Notes (Signed)
Hospital day # 3 pregnancy at [redacted]w[redacted]d- Admitted for left flank pain.  S: Received verbal check out from Dr. Sallye Ober this morning.  Reviewed events over the weekend, imaging, labs, medications started.  Currently diagnosis is unclear but pt is improved from admission.  Pt reports that she feels better.  Currently rates her pain at 3/10.  Still requiring pain medication, states it wears off in 1.5 hours. Still having pain with taking deep breaths.  When lying still, she reports a dull ache in her lower back.  Denies pain with contractions, LOF or VB.  Active fetus.  No headache currently, no RUQ pain but pain does wrap around the lower margin of her ribs bilaterally.  Last dose of Stadol at 0600.  Has kept some clears liquids down but does not really have an appetite.  Pt has not had a BM in 3 days.  Had BM 1-2 times daily prior to admission.    Reports pain "moves"--previously in left flank, then to right flank, then between shoulder blades at present.  Mild nausea, no vomiting.  Taking po fluids in small amounts.  Denies fever, abdominal or epigastric pain, denies contractions, reports +FM.   O: BP 140/85 mmHg  Pulse 86  Temp(Src) 98.3 F (36.8 C) (Oral)  Resp 18  Ht  (1.778 m)  Wt 111.222 kg (245 lb 3.2 oz)  BMI 35.18 kg/m2  SpO2 97%        Gen:  NAD, A&O x 3, lips dry      Lungs: clear      Heart RRR without murmur      Abd gravid, NT, no rebound or guarding, no fundal tenderness      Extremities: edema 1+ and negative Homan's, 2+ DTR.        Neuro DTR 2-3+, no clonus       Cervix:  Closed, soft, thick, midposition.  OB US 02/17/15:  EFW 7+14, >90%ile, AFI 29.23, >97%ile, vtx, anterior placenta-per Dr. Irineo Axon previous note.  Formal report pending.  Renal US:  Negative 02/16/15 Chest Xray (02/18/15): Negative RUQ ultrasound (02/17/15): Negative            Labs:  Results for orders placed or performed during the hospital encounter of 02/16/15 (from the past 24 hour(s))  CBC      Status: Abnormal   Collection Time: 02/19/15  5:22 AM  Result Value Ref Range   WBC 10.3 4.0 - 10.5 K/uL   RBC 3.04 (L) 3.87 - 5.11 MIL/uL   Hemoglobin 9.4 (L) 12.0 - 15.0 g/dL   HCT 40.9 (L) 81.1 - 91.4 %   MCV 91.4 78.0 - 100.0 fL   MCH 30.9 26.0 - 34.0 pg   MCHC 33.8 30.0 - 36.0 g/dL   RDW 78.2 95.6 - 21.3 %   Platelets 194 150 - 400 K/uL  Comprehensive metabolic panel     Status: Abnormal   Collection Time: 02/19/15  5:22 AM  Result Value Ref Range   Sodium 135 135 - 145 mmol/L   Potassium 3.6 3.5 - 5.1 mmol/L   Chloride 104 101 - 111 mmol/L   CO2 22 22 - 32 mmol/L   Glucose, Bld 69 65 - 99 mg/dL   BUN 6 6 - 20 mg/dL   Creatinine, Ser 0.86 0.44 - 1.00 mg/dL   Calcium 8.7 (L) 8.9 - 10.3 mg/dL   Total Protein 5.4 (L) 6.5 - 8.1 g/dL   Albumin 2.5 (L) 3.5 - 5.0 g/dL   AST  16 15 - 41 U/L   ALT 15 14 - 54 U/L   Alkaline Phosphatase 119 38 - 126 U/L   Total Bilirubin 1.6 (H) 0.3 - 1.2 mg/dL   GFR calc non Af Amer >60 >60 mL/min   GFR calc Af Amer >60 >60 mL/min   Anion gap 9 5 - 15  Amylase     Status: Abnormal   Collection Time: 02/19/15  5:22 AM  Result Value Ref Range   Amylase 121 (H) 28 - 100 U/L  Lipase, blood     Status: Abnormal   Collection Time: 02/19/15  5:22 AM  Result Value Ref Range   Lipase 20 (L) 22 - 51 U/L  Glucose, capillary     Status: Abnormal   Collection Time: 02/19/15  8:30 AM  Result Value Ref Range   Glucose-Capillary 62 (L) 65 - 99 mg/dL   Comment 1 Notify RN    Comment 2 Document in Chart    Protein Creatnine ratio-0.1 CBG 97-103  GBS culture not seen. Urine culture-recollection pending.  A: [redacted]w[redacted]d      Upper abdominal pain, etiology unclear.  Improving but  still requiring pain medication.     N/V improved, tolerating a small amount of clear liquids.  Elevated Amylase, now trending down.  Hypokalemia-s/p      repletion.     Gestational HTN- On Labetalol 100 mg BID. BP normal to mildly elevated. Asymptomatic, Brisk reflexes.      Polyhydramnios     LGA fetus     Gestational diabetes, diet controlled.  P:   Continue inpatient observation.  Will consult MFM to help with diagnosis, recommendations.  Discussed  possibility of therapeutic amniocentesis.       Discontinue IV pain medication.  Percocet 5/325 1-2 tablets every 4 hours prn pain.        Saline lock IV.  Advance diet as tolerated.  Continue Protonix.  Start Zofran prn.  Monitor BP closely.  Repeat protein/creatnine ratio.  NST q shift.             Collect GBS culture (PCR).  F/u on urine culture.    Geryl Rankins CNM, [place] 02/19/2015 9:41 AM

## 2015-02-19 NOTE — Discharge Summary (Signed)
Physician Discharge Summary  Patient ID: Sara Reid MRN: [id number] DOB/AGE: 10-03-1985 29 y.o.  Admit date: 02/16/2015 Discharge date: 02/19/2015  Admission Diagnoses: IUP @ 29 3/7 weeks, Left flank pain  Discharge Diagnoses: Left flank pain, SOB, Abdominal pain with unclear etiology Principal Problem:   Kidney stone complicating pregnancy   Discharged Condition: Improved  Hospital Course: Pt's pain changed locations as above. N/V.  IV pain medications administered.  Work up proved negative.  Renal ultrasound negative, Abdominal ultrasound negative, Chest Xray neg.  Day of discharge pt was significantly better.  Day of discharge pt was dehydrated and given IVF bolus.  Consults: None  Significant Diagnostic Studies: radiology: As above.  Treatments: IV hydration and analgesia: Stadol  Discharge Exam: Blood pressure 130/68, pulse 90, temperature 98.6 F (37 C), temperature source Oral, resp. rate 18, height 5\' 10"  (1.778 m), weight 111.222 kg (245 lb 3.2 oz), SpO2 97 %. General appearance: alert, cooperative and no distress GI: normal findings: no masses palpable and soft, non-tender Extremities: no edema, redness or tenderness in the calves or thighs  Disposition: Final discharge disposition not confirmed  Discharge Instructions    Discharge activity:  No Restrictions    Complete by:  As directed      Discharge diet:    Complete by:  As directed   Diabetic diet, low sodium.     Discharge instructions    Complete by:  As directed   See discharge instructions.     Fetal Kick Count:  Lie on our left side for one hour after a meal, and count the number of times your baby kicks.  If it is less than 5 times, get up, move around and drink some juice.  Repeat the test 30 minutes later.  If it is still less than 5 kicks in an hour, notify your doctor.    Complete by:  As directed      LABOR:  When conractions begin, you should start to time them from the beginning of one  contraction to the beginning  of the next.  When contractions are 5 - 10 minutes apart or less and have been regular for at least an hour, you should call your health care provider.    Complete by:  As directed      No sexual activity restrictions    Complete by:  As directed      Notify physician for bleeding from the vagina    Complete by:  As directed      Notify physician for blurring of vision or spots before the eyes    Complete by:  As directed      Notify physician for chills or fever    Complete by:  As directed      Notify physician for fainting spells, "black outs" or loss of consciousness    Complete by:  As directed      Notify physician for increase in vaginal discharge    Complete by:  As directed      Notify physician for leaking of fluid    Complete by:  As directed      Notify physician for pain or burning when urinating    Complete by:  As directed      Notify physician for pelvic pressure (sudden increase)    Complete by:  As directed      Notify physician for severe or continued nausea or vomiting    Complete by:  As directed  Notify physician for sudden gushing of fluid from the vagina (with or without continued leaking)    Complete by:  As directed      Notify physician for sudden, constant, or occasional abdominal pain    Complete by:  As directed      Notify physician if baby moving less than usual    Complete by:  As directed             Medication List    TAKE these medications        acetaminophen 500 MG tablet  Commonly known as:  TYLENOL  Take 1,000 mg by mouth every 6 (six) hours as needed for mild pain or headache.     IRON PO  Take 1 tablet by mouth daily.     lisdexamfetamine 30 MG capsule  Commonly known as:  VYVANSE  Take 1 capsule (30 mg total) by mouth daily. Lower dose as pregnant. May fill 04/26/15     oxyCODONE-acetaminophen 5-325 MG per tablet  Commonly known as:  PERCOCET/ROXICET  Take 1-2 tablets by mouth every 4 (four)  hours as needed for moderate pain or severe pain.     prenatal multivitamin Tabs tablet  Take 1 tablet by mouth at bedtime.     sertraline 100 MG tablet  Commonly known as:  ZOLOFT  Take 1 tablet (100 mg total) by mouth daily.           Follow-up Information    Follow up with [name], M, DO. Schedule an appointment as soon as possible for a visit in 3 days.   Specialty:  Obstetrics and Gynecology   Why:  Hospital follow up   Contact information:   [address] [place] [address] [phone]       Signed: Geryl Rankins 02/19/2015, 6:45 PM

## 2015-02-19 NOTE — Discharge Instructions (Signed)
Abdominal Pain During Pregnancy Abdominal pain is common in pregnancy. Most of the time, it does not cause harm. There are many causes of abdominal pain. Some causes are more serious than others. Some of the causes of abdominal pain in pregnancy are easily diagnosed. Occasionally, the diagnosis takes time to understand. Other times, the cause is not determined. Abdominal pain can be a sign that something is very wrong with the pregnancy, or the pain may have nothing to do with the pregnancy at all. For this reason, always tell your health care provider if you have any abdominal discomfort. HOME CARE INSTRUCTIONS  Monitor your abdominal pain for any changes. The following actions may help to alleviate any discomfort you are experiencing:  Do not have sexual intercourse or put anything in your vagina until your symptoms go away completely.  Get plenty of rest until your pain improves.  Drink clear fluids if you feel nauseous. Avoid solid food as long as you are uncomfortable or nauseous.  Only take over-the-counter or prescription medicine as directed by your health care provider.  Keep all follow-up appointments with your health care provider. SEEK IMMEDIATE MEDICAL CARE IF:  You are bleeding, leaking fluid, or passing tissue from the vagina.  You have increasing pain or cramping.  You have persistent vomiting.  You have painful or bloody urination.  You have a fever.  You notice a decrease in your baby's movements.  You have extreme weakness or feel faint.  You have shortness of breath, with or without abdominal pain.  You develop a severe headache with abdominal pain.  You have abnormal vaginal discharge with abdominal pain.  You have persistent diarrhea.  You have abdominal pain that continues even after rest, or gets worse. MAKE SURE YOU:   Understand these instructions.  Will watch your condition.  Will get help right away if you are not doing well or get  worse. Document Released: 06/23/2005 Document Revised: 04/13/2013 Document Reviewed: 01/20/2013 [facility] Patient Information 2015 [place], [place]. This information is not intended to replace advice given to you by your health care provider. Make sure you discuss any questions you have with your health care provider.   Hypertension During Pregnancy Hypertension is also called high blood pressure. Blood pressure moves blood in your body. Sometimes, the force that moves the blood becomes too strong. When you are pregnant, this condition should be watched carefully. It can cause problems for you and your baby. HOME CARE   Make and keep all of your doctor visits.  Take medicine as told by your doctor. Tell your doctor about all medicines you take.  Eat very little salt.  Exercise regularly.  Do not drink alcohol.  Do not smoke.  Do not have drinks with caffeine.  Lie on your left side when resting.  Your health care provider may ask you to take one low-dose aspirin ( ) each day. GET HELP RIGHT AWAY IF:  You have bad belly (abdominal) pain.  You have sudden puffiness (swelling) in the hands, ankles, or face.  You gain 4 pounds (1.8 kilograms) or more in 1 week.  You throw up (vomit) repeatedly.  You have bleeding from the vagina.  You do not feel the baby moving as much.  You have a headache.  You have blurred or double vision.  You have muscle twitching or spasms.  You have shortness of breath.  You have blue fingernails and lips.  You have blood in your pee (urine). MAKE SURE YOU:  Understand  these instructions.  Will watch your condition.  Will get help right away if you are not doing well or get worse. Document Released: 07/26/2010 Document Revised: 11/07/2013 Document Reviewed: 01/20/2013 [facility] Patient Information 2015 [place], [place]. This information is not intended to replace advice given to you by your health care provider. Make sure you discuss any  questions you have with your health care provider.

## 2015-02-19 NOTE — Progress Notes (Signed)
Strip reviewed by second reviewer, Sunnie Nielsen, RN.

## 2015-02-19 NOTE — Consult Note (Signed)
Maternal Fetal Medicine Consultation  Requesting Provider(s): Geryl Rankins, CNM  Reason for consultation: IUP at 35w 6d, abdominal pain, polyhydramnios  HPI: Sara Reid is a 29 year-old G1P0, EDD 03/20/2015 who is currently at 35w 6d currently admitted due to left-sided flank pain / abdominal pain / pleuritic pain.  Over the course of her admission, the left side flank pain radiated toward the back and was associated with vomiting.  She has had a thorough work up since admission - normal renal ultrasound, normal RUQ ultrasound, normal chest X-ray.  Initial Amylase was elevated - although since admission has improved.  She has a mildly elevated total bilirubin (1.6).  The remainder of her labs have been within normal limits.  Through the weekend, she has required IV narcotics and antiemetics to control the pain and vomiting.  Her pain and vomiting have now improved.  Her diet will be advanced today.  Sara Reid prenatal course has been complicated by diet-controlled gestational diabetes.  Her most recent ultrasound showed an estimated fetal weight of 3574 g (> 90th %tile) with mild polyhydramnios of 29 cm.    OB History: OB History    Gravida Para Term Preterm AB TAB SAB Ectopic Multiple Living   1               PMH:  Past Medical History  Diagnosis Date  . Depression   . Neonatal jaundice   . UTI (lower urinary tract infection)   . Gestational diabetes     PSH:  Past Surgical History  Procedure Laterality Date  . Breast biopsy      benign   Meds:  Scheduled Meds: . docusate sodium  100 mg Oral Daily  . labetalol  100 mg Oral BID  . pantoprazole  40 mg Oral Daily  . prenatal multivitamin  1 tablet Oral Q1200  . simethicone  80 mg Oral QID   Continuous Infusions: . lactated ringers     PRN Meds:.acetaminophen, butorphanol, calcium carbonate, metoCLOPramide (REGLAN) injection, oxyCODONE-acetaminophen, promethazine   Allergies:  Allergies  Allergen Reactions   . Amoxicillin-Pot Clavulanate Rash  . Ceclor [Cefaclor] Rash  . Eryzole Rash    Pediazole   FH:  Family History  Problem Relation Age of Onset  . Hyperlipidemia Mother   . Depression Mother     anxiety  . Hyperlipidemia Father    Soc:  Social History   Social History  . Marital Status: Married    Spouse Name: N/A  . Number of Children: N/A  . 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 Smoking status: Current Every Day Smoker -- 1.00 packs/day  . Smokeless tobacco: Not on file  . Alcohol Use: No     Comment: yes outside pregnancy  . Drug Use: No  . Sexual Activity: Yes   Other Topics Concern  . Not on file   Social History Narrative   Married. Pregnant with first child.       Works as Dietitian: knitting, gardening     Review of Systems: no vaginal bleeding or cramping/contractions, no LOF, no nausea/vomiting. All other systems reviewed and are negative.  PE:   Filed Vitals:   02/19/15 1311  BP: 132/73  Pulse: 89  Temp: 98.2 F (36.8 C)  Resp: 20    GEN: well-appearing female ABD: gravid, NT  Please see separate document for fetal ultrasound report.  Labs: CBC    Component Value Date/Time   WBC 10.3 02/19/2015 0522   RBC 3.04* 02/19/2015 0522   HGB 9.4* 02/19/2015 0522   HCT 27.8* 02/19/2015 0522   PLT 194 02/19/2015 0522   MCV 91.4 02/19/2015 0522   MCH 30.9 02/19/2015 0522   MCHC 33.8 02/19/2015 0522   RDW 13.7 02/19/2015 0522   LYMPHSABS 1.3 02/16/2015 2130   MONOABS 0.8 02/16/2015 2130   EOSABS 0.0 02/16/2015 2130   BASOSABS 0.0 02/16/2015 2130   CMP     Component Value Date/Time   NA 135 02/19/2015 0522   K 3.6 02/19/2015 0522   CL 104 02/19/2015 0522   CO2 22 02/19/2015 0522   GLUCOSE 69 02/19/2015 0522   BUN 6 02/19/2015 0522   CREATININE 0.52 02/19/2015 0522   CALCIUM 8.7*  02/19/2015 0522   PROT 5.4* 02/19/2015 0522   ALBUMIN 2.5* 02/19/2015 0522   AST 16 02/19/2015 0522   ALT 15 02/19/2015 0522   ALKPHOS 119 02/19/2015 0522   BILITOT 1.6* 02/19/2015 0522   GFRNONAA >60 02/19/2015 0522   GFRAA >60 02/19/2015 0522     A/P: 1) Single IUP at 36w 6d  2) Abdominal / flank pain - the patient has undergone a thorough work up for her abdominal pain that has improved since admission.  At this point, there is no clear etiology for her pain.  The elevated (now improving) amylase and mildly elevated Total bilirubin may be suggestive of a small gall stone despite the results of the RUQ ultrasound.  Her pain has improved, so I would not suggest any further evaluation at this point - particularly if she is able to tolerate a regular diet.  If she continues to experience pain, would recommend a GI consult for further evaluation.  3) Gestational diabetes / fetal macrosomia / new finding of mild polyhydramnios - would not recommend amnioreduction based on the currently level of polyhdramnions.  I would , however, recommend 2x weekly NSTS and weekly fluid checks once the patient is discharged home.  If the polyhydramnios persists, would recommend delivery by [redacted] weeks gestation  4) ? Gestational hypertension - it is currently unclear if the elevated blood pressures from admission were from her pain or if this is indeed gestational hypertension.  If she continues to have elevated blood pressures, would feel that this is most likely the later.  The new ACOG recommendations recommends that gestational hypertension and preeclampsia without severe features be managed identically.  If the patient does indeed meet criteria for gestational hypertension, would recommend delivery at 37 weeks.  Thank you for the opportunity to be a part of the care of [employer]. Please contact our office if we can be of further assistance.   I spent approximately 30 minutes with this patient with  over 50% of time spent in face-to-face counseling.  Alpha Gula, MD Maternal Fetal Medicine

## 2015-02-20 LAB — TYPE AND SCREEN
ABO/RH(D): A NEG
Antibody Screen: POSITIVE
DAT, IgG: NEGATIVE
Unit division: 0
Unit division: 0

## 2015-02-21 LAB — CULTURE, OB URINE

## 2015-02-26 ENCOUNTER — Encounter (HOSPITAL_COMMUNITY): Payer: Self-pay | Admitting: *Deleted

## 2015-02-26 ENCOUNTER — Inpatient Hospital Stay (HOSPITAL_COMMUNITY)
Admission: AD | Admit: 2015-02-26 | Discharge: 2015-02-26 | Disposition: A | Discharge status: Home / Self Care | Payer: 59 | Source: Ambulatory Visit | Attending: Obstetrics & Gynecology | Admitting: Obstetrics & Gynecology

## 2015-02-26 DIAGNOSIS — O99343 Other mental disorders complicating pregnancy, third trimester: Secondary | ICD-10-CM | POA: Insufficient documentation

## 2015-02-26 DIAGNOSIS — O133 Gestational [pregnancy-induced] hypertension without significant proteinuria, third trimester: Secondary | ICD-10-CM | POA: Diagnosis not present

## 2015-02-26 DIAGNOSIS — O99333 Smoking (tobacco) complicating pregnancy, third trimester: Secondary | ICD-10-CM | POA: Diagnosis not present

## 2015-02-26 DIAGNOSIS — F329 Major depressive disorder, single episode, unspecified: Secondary | ICD-10-CM | POA: Insufficient documentation

## 2015-02-26 DIAGNOSIS — I10 Essential (primary) hypertension: Secondary | ICD-10-CM | POA: Diagnosis present

## 2015-02-26 DIAGNOSIS — Z3A36 36 weeks gestation of pregnancy: Secondary | ICD-10-CM | POA: Diagnosis not present

## 2015-02-26 DIAGNOSIS — F17210 Nicotine dependence, cigarettes, uncomplicated: Secondary | ICD-10-CM | POA: Diagnosis not present

## 2015-02-26 DIAGNOSIS — Z79899 Other long term (current) drug therapy: Secondary | ICD-10-CM | POA: Insufficient documentation

## 2015-02-26 LAB — COMPREHENSIVE METABOLIC PANEL
ALBUMIN: 2.4 g/dL — AB (ref 3.5–5.0)
ALT: 27 U/L (ref 14–54)
AST: 36 U/L (ref 15–41)
Alkaline Phosphatase: 149 U/L — ABNORMAL HIGH (ref 38–126)
Anion gap: 8 (ref 5–15)
BILIRUBIN TOTAL: 0.6 mg/dL (ref 0.3–1.2)
BUN: 6 mg/dL (ref 6–20)
CO2: 23 mmol/L (ref 22–32)
CREATININE: 0.77 mg/dL (ref 0.44–1.00)
Calcium: 10.4 mg/dL — ABNORMAL HIGH (ref 8.9–10.3)
Chloride: 104 mmol/L (ref 101–111)
GFR calc Af Amer: >60 mL/min (ref >60)
GLUCOSE: 92 mg/dL (ref 65–99)
Potassium: 3.7 mmol/L (ref 3.5–5.1)
Sodium: 135 mmol/L (ref 135–145)
TOTAL PROTEIN: 5.9 g/dL — AB (ref 6.5–8.1)

## 2015-02-26 LAB — URINE MICROSCOPIC-ADD ON

## 2015-02-26 LAB — URINALYSIS, ROUTINE W REFLEX MICROSCOPIC
Glucose, UA: NEGATIVE mg/dL
Ketones, ur: 15 mg/dL — AB
Leukocytes, UA: NEGATIVE
Nitrite: NEGATIVE
Protein, ur: NEGATIVE mg/dL
SPECIFIC GRAVITY, URINE: 1.01 (ref 1.005–1.030)
UROBILINOGEN UA: 0.2 mg/dL (ref 0.0–1.0)
pH: 6.5 (ref 5.0–8.0)

## 2015-02-26 LAB — CBC
HEMATOCRIT: 31.2 % — AB (ref 36.0–46.0)
HEMOGLOBIN: 10.8 g/dL — AB (ref 12.0–15.0)
MCH: 31 pg (ref 26.0–34.0)
MCHC: 34.6 g/dL (ref 30.0–36.0)
MCV: 89.7 fL (ref 78.0–100.0)
Platelets: 225 10*3/uL (ref 150–400)
RBC: 3.48 MIL/uL — AB (ref 3.87–5.11)
RDW: 12.9 % (ref 11.5–15.5)
WBC: 6.7 10*3/uL (ref 4.0–10.5)

## 2015-02-26 LAB — PROTEIN / CREATININE RATIO, URINE
Creatinine, Urine: 357.62 mg/dL
PROTEIN CREATININE RATIO: 0.09 mg/mg{creat} (ref 0.00–0.15)
TOTAL PROTEIN, URINE: 31 mg/dL

## 2015-02-26 LAB — LACTATE DEHYDROGENASE: LDH: 156 U/L (ref 98–192)

## 2015-02-26 LAB — URIC ACID: URIC ACID, SERUM: 6.8 mg/dL — AB (ref 2.3–6.6)

## 2015-02-26 NOTE — Discharge Instructions (Signed)

## 2015-02-26 NOTE — MAU Note (Signed)
Sent from MD office for further evaluation of hypertension.

## 2015-02-26 NOTE — MAU Provider Note (Signed)
History     CSN: [id number]  Arrival date and time: 02/26/15 1242   First Provider Initiated Contact with Patient 02/26/15 1416      Chief Complaint  Patient presents with  . Hypertension   HPI   [name] is a 29 y.o. female G1P0 at 68w6dpresenting today from her OB's office with elevated BP readings. She has a history of pregnancy induced hypertension without preeclampsia. She is not currently being treated for HTN; she was given medication while here in MAU, however was not sent home with medication.   + fetal movements Denies leaking of fluid Denies vaginal bleeding.   OB History    Gravida Para Term Preterm AB TAB SAB Ectopic Multiple Living   1               Past Medical History  Diagnosis Date  . Depression   . Neonatal jaundice   . UTI (lower urinary tract infection)   . Gestational diabetes     Past Surgical History  Procedure Laterality Date  . Breast biopsy      benign    Family History  Problem Relation Age of Onset  . Hyperlipidemia Mother   . Depression Mother     anxiety  . Hyperlipidemia Father     Social History  Substance Use Topics  . Smoking status: Current Every Day Smoker -- 1.00 packs/day  . Smokeless tobacco: None  . Alcohol Use: No     Comment: yes outside pregnancy    Allergies:  Allergies  Allergen Reactions  . Amoxicillin-Pot Clavulanate Rash  . Ceclor [Cefaclor] Rash  . Eryzole Rash    Pediazole    Prescriptions prior to admission  Medication Sig Dispense Refill Last Dose  . acetaminophen (TYLENOL) 500 MG tablet Take 1,000 mg by mouth every 6 (six) hours as needed for mild pain or headache.   02/25/2015 at Unknown time  . IRON PO Take 1 tablet by mouth daily.   02/26/2015 at Unknown time  . lisdexamfetamine (VYVANSE) 30 MG capsule Take 1 capsule (30 mg total) by mouth daily. Lower dose as pregnant. May fill 04/26/15 30 capsule 0 02/26/2015 at Unknown time  . ondansetron (ZOFRAN) 4 MG tablet Take 1  tablet (4 mg total) by mouth once. (Patient taking differently: Take 4 mg by mouth 2 (two) times daily as needed. ) 20 tablet 0 02/26/2015 at Unknown time  . oxyCODONE-acetaminophen (PERCOCET/ROXICET) 5-325 MG per tablet Take 1-2 tablets by mouth every 4 (four) hours as needed for moderate pain or severe pain. 15 tablet 0 Past Week at Unknown time  . Prenatal Vit-Fe Fumarate-FA (PRENATAL MULTIVITAMIN) TABS tablet Take 1 tablet by mouth at bedtime.   02/25/2015 at Unknown time  . sertraline (ZOLOFT) 100 MG tablet Take 1 tablet (100 mg total) by mouth daily. 90 tablet 1 02/26/2015 at Unknown time   Results for orders placed or performed during the hospital encounter of 02/26/15 (from the past 48 hour(s))  Protein / creatinine ratio, urine     Status: None   Collection Time: 02/26/15  1:26 PM  Result Value Ref Range   Creatinine, Urine 357.62 mg/dL   Total Protein, Urine 31 mg/dL    Comment: NO NORMAL RANGE ESTABLISHED FOR THIS TEST   Protein Creatinine Ratio 0.09 0.00 - 0.15 mg/mg[Cre]    Comment: [facility]inalysis, Routine w reflex microscopic ([facility]     Status: Abnormal   Collection Time: 02/26/15  1:26 PM  Result Value Ref Range   Color, Urine YELLOW YELLOW   APPearance HAZY (A) CLEAR   Specific Gravity, Urine 1.010 1.005 - 1.030   pH 6.5 5.0 - 8.0   Glucose, UA NEGATIVE NEGATIVE mg/dL   Hgb urine dipstick MODERATE (A) NEGATIVE   Bilirubin Urine MODERATE (A) NEGATIVE   Ketones, ur 15 (A) NEGATIVE mg/dL   Protein, ur NEGATIVE NEGATIVE mg/dL   Urobilinogen, UA 0.2 0.0 - 1.0 mg/dL   Nitrite NEGATIVE NEGATIVE   Leukocytes, UA NEGATIVE NEGATIVE  Urine microscopic-add on     Status: Abnormal   Collection Time: 02/26/15  1:26 PM  Result Value Ref Range   Squamous Epithelial / LPF FEW (A) RARE   WBC, UA 0-2 <3 WBC/hpf   RBC / HPF 3-6 <3 RBC/hpf   Bacteria, UA MANY (A) RARE   Crystals CA OXALATE CRYSTALS (A) NEGATIVE   Urine-Other MUCOUS PRESENT   CBC      Status: Abnormal   Collection Time: 02/26/15  2:17 PM  Result Value Ref Range   WBC 6.7 4.0 - 10.5 K/uL   RBC 3.48 (L) 3.87 - 5.11 MIL/uL   Hemoglobin 10.8 (L) 12.0 - 15.0 g/dL   HCT 31.2 (L) 36.0 - 46.0 %   MCV 89.7 78.0 - 100.0 fL   MCH 31.0 26.0 - 34.0 pg   MCHC 34.6 30.0 - 36.0 g/dL   RDW 12.9 11.5 - 15.5 %   Platelets 225 150 - 400 K/uL  Comprehensive metabolic panel     Status: Abnormal   Collection Time: 02/26/15  2:17 PM  Result Value Ref Range   Sodium 135 135 - 145 mmol/L   Potassium 3.7 3.5 - 5.1 mmol/L   Chloride 104 101 - 111 mmol/L   CO2 23 22 - 32 mmol/L   Glucose, Bld 92 65 - 99 mg/dL   BUN 6 6 - 20 mg/dL   Creatinine, Ser 0.77 0.44 - 1.00 mg/dL   Calcium 10.4 (H) 8.9 - 10.3 mg/dL   Total Protein 5.9 (L) 6.5 - 8.1 g/dL   Albumin 2.4 (L) 3.5 - 5.0 g/dL   AST 36 15 - 41 U/L   ALT 27 14 - 54 U/L   Alkaline Phosphatase 149 (H) 38 - 126 U/L   Total Bilirubin 0.6 0.3 - 1.2 mg/dL   GFR calc non Af Amer >60 >60 mL/min   GFR calc Af Amer >60 >60 mL/min    Comment: (NOTE) The eGFR has been calculated using the CKD EPI equation. This calculation has not been validated in all clinical situations. eGFR's persistently <60 mL/min signify possible Chronic Kidney Disease.    Anion gap 8 5 - 15    Comment: Performed at [facility]  Lactate dehydrogenase     Status: None   Collection Time: 02/26/15  2:17 PM  Result Value Ref Range   LDH 156 98 - 192 U/L    Comment: Performed at [facility]    Review of Systems  Constitutional: Negative for fever and chills.  Eyes: Negative for blurred vision.  Cardiovascular: Negative for leg swelling.  Gastrointestinal: Positive for nausea and vomiting. Negative for abdominal pain.  Genitourinary: Negative for dysuria, urgency, frequency, hematuria and flank pain.  Neurological: Negative for headaches.   Physical Exam   Blood pressure 153/92, pulse 86, resp. rate 18, height _0  (1.778 m), weight 110.678 kg (244  lb).  Physical Exam  Constitutional: She is oriented to person, place, and  time. She appears well-developed and well-nourished.  Non-toxic appearance. She does not have a sickly appearance. She does not appear ill. No distress.  HENT:  Head: Normocephalic.  Eyes: Pupils are equal, round, and reactive to light.  Neck: Neck supple.  Cardiovascular: Normal rate.   Respiratory: Effort normal and breath sounds normal.  GI: Soft. She exhibits no distension. There is no tenderness. There is no rebound and no guarding.  Musculoskeletal: Normal range of motion. She exhibits no edema or tenderness.  Negative clonus   Neurological: She is alert and oriented to person, place, and time. She has normal reflexes. She displays normal reflexes.  Skin: Skin is warm. She is not diaphoretic.  Psychiatric: Her behavior is normal.    Fetal Tracing: Baseline: 145 bpm  Variability: Moderate  Accelerations: 15x15 Decelerations: quick variables  Toco: 2-3 mins with irregular pattern    MAU Course  Procedures  None  MDM  She denies pain at this time.   Discussed labs, BP's with Dr. Nelda Marseille at [phone]. Dr. Nelda Marseille has reviewed the labs.   Assessment and Plan   A:  1. Pregnancy induced hypertension, third trimester    P:  Discharge home per Dr. Nelda Marseille Dr. Annie Main office will call the patient in the next 24 hours to schedule induction for Wednesday night (8/24) Preeclampsia precautions discussed at length.  Labor precautions Return to MAU if symptoms worsen   Lezlie Lye, NP 02/26/2015 2:51 PM

## 2015-02-27 ENCOUNTER — Telehealth (HOSPITAL_COMMUNITY): Payer: Self-pay | Admitting: *Deleted

## 2015-02-27 NOTE — Telephone Encounter (Signed)
Preadmission screen  

## 2015-02-28 ENCOUNTER — Other Ambulatory Visit: Payer: Self-pay | Admitting: Obstetrics and Gynecology

## 2015-02-28 ENCOUNTER — Other Ambulatory Visit (HOSPITAL_COMMUNITY): Payer: Self-pay | Admitting: Obstetrics & Gynecology

## 2015-02-28 ENCOUNTER — Inpatient Hospital Stay (HOSPITAL_COMMUNITY)
Admission: AD | Admit: 2015-02-28 | Discharge: 2015-03-03 | DRG: 775 | Disposition: A | Discharge status: Home / Self Care | Payer: 59 | Source: Ambulatory Visit | Attending: Obstetrics & Gynecology | Admitting: Obstetrics & Gynecology

## 2015-02-28 ENCOUNTER — Other Ambulatory Visit: Payer: Self-pay | Admitting: Obstetrics & Gynecology

## 2015-02-28 ENCOUNTER — Encounter (HOSPITAL_COMMUNITY): Payer: Self-pay

## 2015-02-28 DIAGNOSIS — O133 Gestational [pregnancy-induced] hypertension without significant proteinuria, third trimester: Secondary | ICD-10-CM

## 2015-02-28 DIAGNOSIS — O24420 Gestational diabetes mellitus in childbirth, diet controlled: Secondary | ICD-10-CM | POA: Diagnosis present

## 2015-02-28 DIAGNOSIS — O9902 Anemia complicating childbirth: Secondary | ICD-10-CM | POA: Diagnosis present

## 2015-02-28 DIAGNOSIS — O99824 Streptococcus B carrier state complicating childbirth: Secondary | ICD-10-CM | POA: Diagnosis present

## 2015-02-28 DIAGNOSIS — Z880 Allergy status to penicillin: Secondary | ICD-10-CM

## 2015-02-28 DIAGNOSIS — D649 Anemia, unspecified: Secondary | ICD-10-CM | POA: Diagnosis present

## 2015-02-28 DIAGNOSIS — Z6791 Unspecified blood type, Rh negative: Secondary | ICD-10-CM

## 2015-02-28 DIAGNOSIS — O24419 Gestational diabetes mellitus in pregnancy, unspecified control: Secondary | ICD-10-CM | POA: Diagnosis present

## 2015-02-28 DIAGNOSIS — O26893 Other specified pregnancy related conditions, third trimester: Secondary | ICD-10-CM | POA: Diagnosis present

## 2015-02-28 DIAGNOSIS — Z3A37 37 weeks gestation of pregnancy: Secondary | ICD-10-CM

## 2015-02-28 DIAGNOSIS — R109 Unspecified abdominal pain: Secondary | ICD-10-CM

## 2015-02-28 DIAGNOSIS — O26899 Other specified pregnancy related conditions, unspecified trimester: Secondary | ICD-10-CM

## 2015-02-28 DIAGNOSIS — O24410 Gestational diabetes mellitus in pregnancy, diet controlled: Secondary | ICD-10-CM

## 2015-02-28 LAB — CBC
HCT: 30.3 % — ABNORMAL LOW (ref 36.0–46.0)
Hemoglobin: 10.5 g/dL — ABNORMAL LOW (ref 12.0–15.0)
MCH: 30.6 pg (ref 26.0–34.0)
MCHC: 34.7 g/dL (ref 30.0–36.0)
MCV: 88.3 fL (ref 78.0–100.0)
PLATELETS: 252 10*3/uL (ref 150–400)
RBC: 3.43 MIL/uL — ABNORMAL LOW (ref 3.87–5.11)
RDW: 12.9 % (ref 11.5–15.5)
WBC: 7.6 10*3/uL (ref 4.0–10.5)

## 2015-02-28 LAB — GLUCOSE, CAPILLARY: Glucose-Capillary: 80 mg/dL (ref 65–99)

## 2015-02-28 MED ORDER — LACTATED RINGERS IV SOLN
INTRAVENOUS | Status: DC
  Administered 2015-02-28 – 2015-03-01 (×2): via INTRAVENOUS
  Administered 2015-03-01: 1000 mL via INTRAVENOUS

## 2015-02-28 MED ORDER — VANCOMYCIN HCL IN DEXTROSE 1-5 GM/200ML-% IV SOLN
1000.0000 mg | Freq: Two times a day (BID) | INTRAVENOUS | Status: DC
  Administered 2015-03-01 (×2): 1000 mg via INTRAVENOUS
  Filled 2015-02-28 (×3): qty 200

## 2015-02-28 MED ORDER — LACTATED RINGERS IV SOLN: 500.0000 mL | INTRAVENOUS | Status: DC | PRN

## 2015-02-28 MED ORDER — TERBUTALINE SULFATE 1 MG/ML IJ SOLN: 0.2500 mg | Freq: Once | INTRAMUSCULAR | Status: DC | PRN

## 2015-02-28 MED ORDER — CITRIC ACID-SODIUM CITRATE 334-500 MG/5ML PO SOLN
30.0000 mL | ORAL | Status: DC | PRN
  Administered 2015-03-01: 30 mL via ORAL
  Filled 2015-02-28: qty 15

## 2015-02-28 MED ORDER — ACETAMINOPHEN 325 MG PO TABS: 650.0000 mg | ORAL_TABLET | ORAL | Status: DC | PRN

## 2015-02-28 MED ORDER — OXYTOCIN 40 UNITS IN LACTATED RINGERS INFUSION - SIMPLE MED: 62.5000 mL/h | INTRAVENOUS | Status: DC

## 2015-02-28 MED ORDER — OXYTOCIN BOLUS FROM INFUSION
500.0000 mL | INTRAVENOUS | Status: DC
  Administered 2015-03-01: 500 mL via INTRAVENOUS

## 2015-02-28 MED ORDER — OXYCODONE-ACETAMINOPHEN 5-325 MG PO TABS: 2.0000 | ORAL_TABLET | ORAL | Status: DC | PRN

## 2015-02-28 MED ORDER — MISOPROSTOL 25 MCG QUARTER TABLET
25.0000 ug | ORAL_TABLET | ORAL | Status: DC | PRN
  Administered 2015-03-01 (×2): 25 ug via VAGINAL
  Filled 2015-02-28: qty 0.25
  Filled 2015-02-28: qty 1
  Filled 2015-02-28: qty 0.25

## 2015-02-28 MED ORDER — OXYCODONE-ACETAMINOPHEN 5-325 MG PO TABS: 1.0000 | ORAL_TABLET | ORAL | Status: DC | PRN

## 2015-02-28 MED ORDER — ONDANSETRON HCL 4 MG/2ML IJ SOLN
4.0000 mg | Freq: Four times a day (QID) | INTRAMUSCULAR | Status: DC | PRN
  Administered 2015-03-01: 4 mg via INTRAVENOUS
  Filled 2015-02-28: qty 2

## 2015-02-28 MED ORDER — LIDOCAINE HCL (PF) 1 % IJ SOLN
30.0000 mL | INTRAMUSCULAR | Status: DC | PRN
Start: 2015-02-28 — End: 2015-03-03
  Administered 2015-03-01: 30 mL via SUBCUTANEOUS
  Filled 2015-02-28: qty 30

## 2015-02-28 NOTE — H&P (Signed)
  HPI: 29 y/o G1P0 @ [redacted]w[redacted]d estimated gestational age (as dated by LMP c/w 20 week ultrasound) presents for IOL due to gestational HTN.   no Leaking of Fluid,   no Vaginal Bleeding,   no Uterine Contractions,  + Fetal Movement.  ROS: no HA, no epigastric pain, no visual changes.    Pregnancy complicated by: 1) GDMA1- diet controlled.  Followed by NST twice weekly.  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 (  daily), followed by Neurology, has been off medication for several weeks 4) Rh negative, s/p RhoGAM 5) Anemia in pregnancy: Hgb 10.9, iron daily, last Hgb 8/22: 10.8   Prenatal Transfer Tool  Maternal Diabetes: Yes:  Diabetes Type:  Diet controlled Genetic Screening: Normal- panorama low risk, AFP negative Maternal Ultrasounds/Referrals: Normal Fetal Ultrasounds or other Referrals:  None Maternal Substance Abuse:  Yes:  Type: Smoker-quit once she found out she was pregnant  Significant Maternal Medications:  Meds include: Other: Vyvanse Significant Maternal Lab Results: Lab values include: Group B Strep positive, Rh negative   PNL:  GBS positive- Clindamycin resistant with known PCN allergy , Rub Immune, Hep B neg, RPR NR, HIV neg, GC/C neg, glucola:157, abnormal 3hr GTT Hgb: 10.9- on integra daily Blood type: A negative, antibody negative Immunizations: Tdap given 12/01/14 OBHx: primip PMHx:  ADD Meds:  PNV, Vyvanse, Integra daily Allergy:   Allergies  Allergen Reactions  . Amoxicillin-Pot Clavulanate Rash  . Ceclor [Cefaclor] Rash  . Eryzole Rash    Pediazole   SurgHx: none SocHx:   no Tobacc use during pregnancy, no  EtOH, no Illicit Drugs  O: Examination performed in office (02/26/15) Vitals to be obtained on arrival Gen. AAOx3, NAD CV.  RRR  No murmur.  Resp. CTAB, no wheeze or crackles. Abd. Gravid,  no tenderness,  no rigidity,  no guarding Extr.  Minimal edema  B/L , no calf tenderness FHT: 135 by doppler Toco: none SVE: closed/soft/-3   Labs: see orders  A/P:  29 y.o. G1P0 @ [redacted]w[redacted]d EGA who presents for IOL due to gestational HTN  -FWB:  Reassuring by NST in office -Labor: plan for IOL with cytotec -GBS: positive, Vancomycin when in active labor or with placement of Foley balloon -Pain management: Stadol or epidural upon pt request -GDMA1: accucheck upon arrival -Gestational HTN: Close monitoring of BP, last labs performed on 8/22.  If BP >140/90 on arrival plan for repeat labwork (CMP, uric acid, PC ratio) -CBC, T&S upon arrival  [name], [place] [phone] (pager) [phone] (office)

## 2015-03-01 ENCOUNTER — Inpatient Hospital Stay (HOSPITAL_COMMUNITY): Payer: 59 | Admitting: Anesthesiology

## 2015-03-01 LAB — GLUCOSE, CAPILLARY
Glucose-Capillary: 62 mg/dL — ABNORMAL LOW (ref 65–99)
Glucose-Capillary: 75 mg/dL (ref 65–99)

## 2015-03-01 LAB — COMPREHENSIVE METABOLIC PANEL
ALBUMIN: 2.6 g/dL — AB (ref 3.5–5.0)
ALT: 24 U/L (ref 14–54)
ANION GAP: 8 (ref 5–15)
AST: 31 U/L (ref 15–41)
Alkaline Phosphatase: 157 U/L — ABNORMAL HIGH (ref 38–126)
BILIRUBIN TOTAL: 0.5 mg/dL (ref 0.3–1.2)
BUN: 12 mg/dL (ref 6–20)
CO2: 19 mmol/L — ABNORMAL LOW (ref 22–32)
Calcium: 9.5 mg/dL (ref 8.9–10.3)
Chloride: 106 mmol/L (ref 101–111)
Creatinine, Ser: 0.71 mg/dL (ref 0.44–1.00)
GFR calc Af Amer: >60 mL/min (ref >60)
GFR calc non Af Amer: >60 mL/min (ref >60)
GLUCOSE: 84 mg/dL (ref 65–99)
POTASSIUM: 3.8 mmol/L (ref 3.5–5.1)
SODIUM: 133 mmol/L — AB (ref 135–145)
TOTAL PROTEIN: 5.6 g/dL — AB (ref 6.5–8.1)

## 2015-03-01 LAB — CBC
HEMATOCRIT: 30.7 % — AB (ref 36.0–46.0)
Hemoglobin: 10.3 g/dL — ABNORMAL LOW (ref 12.0–15.0)
MCH: 30.3 pg (ref 26.0–34.0)
MCHC: 33.6 g/dL (ref 30.0–36.0)
MCV: 90.3 fL (ref 78.0–100.0)
PLATELETS: 195 10*3/uL (ref 150–400)
RBC: 3.4 MIL/uL — ABNORMAL LOW (ref 3.87–5.11)
RDW: 13 % (ref 11.5–15.5)
WBC: 8 10*3/uL (ref 4.0–10.5)

## 2015-03-01 LAB — TYPE AND SCREEN
ABO/RH(D): A NEG
Antibody Screen: NEGATIVE

## 2015-03-01 LAB — PROTEIN / CREATININE RATIO, URINE: CREATININE, URINE: 38 mg/dL

## 2015-03-01 LAB — URIC ACID: Uric Acid, Serum: 7.2 mg/dL — ABNORMAL HIGH (ref 2.3–6.6)

## 2015-03-01 MED ORDER — LABETALOL HCL 5 MG/ML IV SOLN: 20.0000 mg | INTRAVENOUS | Status: DC | PRN

## 2015-03-01 MED ORDER — BUTORPHANOL TARTRATE 1 MG/ML IJ SOLN
1.0000 mg | Freq: Once | INTRAMUSCULAR | Status: AC
  Administered 2015-03-01: 1 mg via INTRAVENOUS
  Filled 2015-03-01: qty 1

## 2015-03-01 MED ORDER — TERBUTALINE SULFATE 1 MG/ML IJ SOLN: 0.2500 mg | Freq: Once | INTRAMUSCULAR | Status: DC | PRN

## 2015-03-01 MED ORDER — FENTANYL 2.5 MCG/ML BUPIVACAINE 1/10 % EPIDURAL INFUSION (WH - ANES)
14.0000 mL/h | INTRAMUSCULAR | Status: DC | PRN
  Administered 2015-03-01 (×2): 14 mL/h via EPIDURAL
  Filled 2015-03-01: qty 125

## 2015-03-01 MED ORDER — LIDOCAINE HCL (PF) 1 % IJ SOLN
INTRAMUSCULAR | Status: DC | PRN
  Administered 2015-03-01 (×2): 4 mL via EPIDURAL

## 2015-03-01 MED ORDER — HYDRALAZINE HCL 20 MG/ML IJ SOLN: 10.0000 mg | Freq: Once | INTRAMUSCULAR | Status: DC | PRN

## 2015-03-01 MED ORDER — PHENYLEPHRINE 40 MCG/ML (10ML) SYRINGE FOR IV PUSH (FOR BLOOD PRESSURE SUPPORT)
80.0000 ug | PREFILLED_SYRINGE | INTRAVENOUS | Status: DC | PRN
  Filled 2015-03-01: qty 2
  Filled 2015-03-01: qty 20

## 2015-03-01 MED ORDER — FAMOTIDINE 20 MG PO TABS: 20.0000 mg | ORAL_TABLET | Freq: Every day | ORAL | Status: DC

## 2015-03-01 MED ORDER — BUTORPHANOL TARTRATE 1 MG/ML IJ SOLN
INTRAMUSCULAR | Status: AC
  Filled 2015-03-01: qty 1

## 2015-03-01 MED ORDER — OXYTOCIN 40 UNITS IN LACTATED RINGERS INFUSION - SIMPLE MED
1.0000 m[IU]/min | INTRAVENOUS | Status: DC
  Administered 2015-03-01: 2 m[IU]/min via INTRAVENOUS
  Administered 2015-03-01: 12 m[IU]/min via INTRAVENOUS
  Administered 2015-03-01: 10 m[IU]/min via INTRAVENOUS
  Administered 2015-03-01: 6 m[IU]/min via INTRAVENOUS
  Administered 2015-03-01: 8 m[IU]/min via INTRAVENOUS
  Filled 2015-03-01: qty 1000

## 2015-03-01 MED ORDER — EPHEDRINE 5 MG/ML INJ
10.0000 mg | INTRAVENOUS | Status: DC | PRN
  Filled 2015-03-01: qty 2

## 2015-03-01 MED ORDER — DIPHENHYDRAMINE HCL 50 MG/ML IJ SOLN: 12.5000 mg | INTRAMUSCULAR | Status: DC | PRN

## 2015-03-01 NOTE — Plan of Care (Signed)
Problem: Consults Goal: Birthing Suites Patient Information Press F2 to bring up selections list Outcome: Completed/Met Date Met:  03/01/15  Inpatient induction

## 2015-03-01 NOTE — Progress Notes (Signed)
Assumed care of patient of Dr. Rebeca Alert for gestational hypertension, also with GDMA1, Rh negative, GBS positive, resistant to clindamycin, ADD--off med for several weeks.  Subjective: Comfortable, notes occasional, mild UCs.  Denies HA, visual sx, or epigastric pain.  Objective: BP 138/90 mmHg  Pulse 89  Temp(Src) 97.8 F (36.6 C) (Oral)  Resp 18  Ht  (1.778 m)  Wt 110.678 kg (244 lb)  BMI 35.01 kg/m2      Filed Vitals:   02/28/15 2225 02/28/15 2238 02/28/15 2245 02/28/15 2252  BP: 147/94  141/87 138/90  Pulse: 105  94 89  Temp:    97.8 F (36.6 C)  TempSrc:    Oral  Resp: 18     Height:   (1.778 m)    Weight:  110.678 kg (244 lb)       FHT: Category 1 UC:   Irregular, mild SVE:   Dilation: Closed Effacement (%): Thick Station: -3 Exam by:: Susy Manor, RN   CBG (last 3)   Recent Labs  02/28/15 2315  GLUCAP 80     Assessment:  IUP at 37 1/7 weeks--admitted for induction Gestational hypertension GDM1 GBS positive, with clindamycin resistance Unfavorable cervix  Plan: Per Dr. Lawana Chambers orders: IOL with cytotech tonight Accucheck on admission PIH labs and PCR due to BP 147/94 on admission BP treatment per parameters prn. Stadol/epidural prn.  Nigel Bridgeman CNM 03/01/2015, 12:01 AM

## 2015-03-01 NOTE — Progress Notes (Signed)
OB PN:  S: Pt resting comfortably, no acute complaints.  States pain is 3/10  O: BP 142/74 mmHg  Pulse 79  Temp(Src) 98 F (36.7 C) (Oral)  Resp 18  Ht  (1.778 m)  Wt 110.678 kg (244 lb)  BMI 35.01 kg/m2   FHT: 130 bpm, moderate variablity, no accels, no decels Toco: irregular, difficulty monitoring contractions SVE: 3/70/-3, AROM thin meconium- IUPC and FSE placed  A/P: 29 y.o. G1P0 @ [redacted]w[redacted]d admitted for IOL for gestational HTN 1. FWB: Cat. I 2. Labor: continue with Pit per protocol, internal monitors placed for further monitoring and adequate Pitocin induction Pain: Stadol as needed or epidural upon request GBS: positive, Clinda resistant with PCN allergy, IV Vancomycin per protocol Gestational HTN: labs negative, BP range wnl, will continue to closely monitor GDMA1: glucose x3 <100, no further monitoring  [name], DO [phone] (pager) [phone] (office)

## 2015-03-01 NOTE — Progress Notes (Signed)
  Subjective: Aware of UCs, 3-4/10 on pain scale.  Objective: BP 138/90 mmHg  Pulse 98  Temp(Src) 98 F (36.7 C) (Oral)  Resp 20  Ht  (1.778 m)  Wt 110.678 kg (244 lb)  BMI 35.01 kg/m2   Total I/O In: -  Out: 400 [Emesis/NG output:400]   Filed Vitals:   03/01/15 0300 03/01/15 0301 03/01/15 0401 03/01/15 0600  BP: 130/74 130/74 128/72 138/90  Pulse: 82 82 84 98  Temp: 98 F (36.7 C)     TempSrc: Oral     Resp: Height:      Weight:        FHT: Category 1 UC:   regular, every 2-3 minutes SVE:   Dilation: Fingertip Effacement (%): Thick Station: -3 Exam by:: Susy Manor, RN  at [phone] Cervix very posterior.  Received single dose Cytotech 0016  Assessment:  Induction at 37 2/7 weeks--gestational hypertension, GDMA1, GBS positive, clindamycin resistant, PCN allergic Latent labor  Plan: Dr. Charlotta Newton will determine plan of care.  Sara Reid CNM 03/01/2015, 6:18 AM

## 2015-03-01 NOTE — Anesthesia Procedure Notes (Signed)
Epidural Patient location during procedure: OB  Staffing Anesthesiologist: Tonae Livolsi Performed by: anesthesiologist   Preanesthetic Checklist Completed: patient identified, site marked, surgical consent, pre-op evaluation, timeout performed, IV checked, risks and benefits discussed and monitors and equipment checked  Epidural Patient position: sitting Prep: site prepped and draped and DuraPrep Patient monitoring: continuous pulse ox and blood pressure Approach: midline Location: L3-L4 Injection technique: LOR air  Needle:  Needle type: Tuohy  Needle gauge: 17 G Needle length: 9 cm and 9 Needle insertion depth: 8 cm Catheter type: closed end flexible Catheter size: 19 Gauge Catheter at skin depth: 13 cm Test dose: negative  Assessment Events: blood not aspirated, injection not painful, no injection resistance, negative IV test and no paresthesia  Additional Notes Patient identified. Risks/Benefits/Options discussed with patient including but not limited to bleeding, infection, nerve damage, paralysis, failed block, incomplete pain control, headache, blood pressure changes, nausea, vomiting, reactions to medications, itching and postpartum back pain. Confirmed with bedside nurse the patient's most recent platelet count. Confirmed with patient that they are not currently taking any anticoagulation, have any bleeding history or any family history of bleeding disorders. Patient expressed understanding and wished to proceed. All questions were answered. Sterile technique was used throughout the entire procedure. Please see nursing notes for vital signs. Test dose was given through epidural catheter and negative prior to continuing to dose epidural or start infusion. Warning signs of high block given to the patient including shortness of breath, tingling/numbness in hands, complete motor block, or any concerning symptoms with instructions to call for help. Patient was given instructions  on fall risk and not to get out of bed. All questions and concerns addressed with instructions to call with any issues or inadequate analgesia.

## 2015-03-01 NOTE — Progress Notes (Signed)
Off for shower

## 2015-03-01 NOTE — Progress Notes (Signed)
OB PN:  S: Pt resting comfortably, no acute complaints  O: BP 138/90 mmHg  Pulse 98  Temp(Src) 98 F (36.7 C) (Oral)  Resp 20  Ht  (1.778 m)  Wt 110.678 kg (244 lb)  BMI 35.01 kg/m2  BP range: 124-147/67-90  FHT: 140 bpm, moderate variablity, + accels, no decels Toco: irregular SVE: 1/50/-3, cytotec placed  A/P: 29 y.o. G1P0 @ [redacted]w[redacted]d admitted for IOL for gestational HTN 1. FWB: Cat. I 2. Labor: 2nd dose of cytotec placed, plan for Pitocin later this am Pain: Stadol as needed GBS: positive, Clinda resistant with PCN allergy, plan for Vancomycin Gestational HTN: labs negative, BP range as above, will continue to closely monitor GDMA1: glucose 84 on arrival  [place], [place] [phone] (pager) [phone] (office)

## 2015-03-01 NOTE — Anesthesia Preprocedure Evaluation (Signed)
Anesthesia Evaluation  Patient identified by MRN, date of birth, ID band Patient awake    Reviewed: Allergy & Precautions, H&P , NPO status , Patient's Chart, lab work & pertinent test results  Airway Mallampati: I  TM Distance: >3 FB Neck ROM: full    Dental no notable dental hx.    Pulmonary neg pulmonary ROS, Current Smoker,  breath sounds clear to auscultation  Pulmonary exam normal       Cardiovascular negative cardio ROS Normal cardiovascular examRhythm:regular Rate:Normal     Neuro/Psych Depression negative neurological ROS     GI/Hepatic negative GI ROS, Neg liver ROS,   Endo/Other  diabetes, Gestational  Renal/GU negative Renal ROS  negative genitourinary   Musculoskeletal   Abdominal (+) + obese,   Peds  Hematology negative hematology ROS (+)   Anesthesia Other Findings Pregnancy - uncomplicated Platelets and allergies reviewed Denies active cardiac or pulmonary symptoms, METS > 4  Denies blood thinning medications, bleeding disorders, hypertension, asthma, supine hypotension syndrome, previous anesthesia difficulties    Reproductive/Obstetrics (+) Pregnancy                             Anesthesia Physical Anesthesia Plan  ASA: II  Anesthesia Plan: Epidural   Post-op Pain Management:    Induction:   Airway Management Planned:   Additional Equipment:   Intra-op Plan:   Post-operative Plan:   Informed Consent: I have reviewed the patients History and Physical, chart, labs and discussed the procedure including the risks, benefits and alternatives for the proposed anesthesia with the patient or authorized representative who has indicated his/her understanding and acceptance.     Plan Discussed with:   Anesthesia Plan Comments:         Anesthesia Quick Evaluation

## 2015-03-01 NOTE — Progress Notes (Addendum)
OB PN:  S: Pt resting comfortably, no acute complaints.  States pain is 3/10  O: BP 147/87 mmHg  Pulse 86  Temp(Src) 98 F (36.7 C) (Oral)  Resp 20  Ht  (1.778 m)  Wt 110.678 kg (244 lb)  BMI 35.01 kg/m2   FHT: 150 bpm, moderate variablity, + accels, very occasional variable decel Toco: irregular SVE: 2/70/-3  A/P: 29 y.o. G1P0 @ [redacted]w[redacted]d admitted for IOL for gestational HTN 1. FWB: Cat. II due to occasional variable decel, overall FHT reassuring, will continue to closely monitor 2. Labor: continue with Pit per protocol Pain: Stadol as needed or epidural upon request GBS: positive, Clinda resistant with PCN allergy, IV Vancomycin per protocol Gestational HTN: labs negative, BP range wnl, will continue to closely monitor GDMA1: glucose x3 <100, no further monitoring  [name], DO [phone] (pager) [phone] (office)

## 2015-03-02 ENCOUNTER — Encounter (HOSPITAL_COMMUNITY): Payer: Self-pay

## 2015-03-02 ENCOUNTER — Inpatient Hospital Stay (HOSPITAL_COMMUNITY): Admission: RE | Admit: 2015-03-02 | Payer: 59 | Source: Ambulatory Visit

## 2015-03-02 ENCOUNTER — Ambulatory Visit (HOSPITAL_COMMUNITY): Payer: PRIVATE HEALTH INSURANCE

## 2015-03-02 LAB — CBC
HEMATOCRIT: 25.4 % — AB (ref 36.0–46.0)
HEMOGLOBIN: 8.7 g/dL — AB (ref 12.0–15.0)
MCH: 30.6 pg (ref 26.0–34.0)
MCHC: 34.3 g/dL (ref 30.0–36.0)
MCV: 89.4 fL (ref 78.0–100.0)
Platelets: 170 10*3/uL (ref 150–400)
RBC: 2.84 MIL/uL — AB (ref 3.87–5.11)
RDW: 12.8 % (ref 11.5–15.5)
WBC: 8.3 10*3/uL (ref 4.0–10.5)

## 2015-03-02 LAB — RPR: RPR Ser Ql: NONREACTIVE

## 2015-03-02 LAB — CCBB MATERNAL DONOR DRAW

## 2015-03-02 MED ORDER — OXYCODONE-ACETAMINOPHEN 5-325 MG PO TABS: 1.0000 | ORAL_TABLET | ORAL | Status: DC | PRN

## 2015-03-02 MED ORDER — IBUPROFEN 600 MG PO TABS
600.0000 mg | ORAL_TABLET | Freq: Four times a day (QID) | ORAL | Status: DC
  Administered 2015-03-02 – 2015-03-03 (×6): 600 mg via ORAL
  Filled 2015-03-02 (×6): qty 1

## 2015-03-02 MED ORDER — ONDANSETRON HCL 4 MG/2ML IJ SOLN: 4.0000 mg | INTRAMUSCULAR | Status: DC | PRN

## 2015-03-02 MED ORDER — DIPHENHYDRAMINE HCL 25 MG PO CAPS: 25.0000 mg | ORAL_CAPSULE | Freq: Four times a day (QID) | ORAL | Status: DC | PRN

## 2015-03-02 MED ORDER — OXYCODONE-ACETAMINOPHEN 5-325 MG PO TABS
2.0000 | ORAL_TABLET | ORAL | Status: DC | PRN
  Administered 2015-03-02: 2 via ORAL
  Filled 2015-03-02: qty 2

## 2015-03-02 MED ORDER — DIBUCAINE 1 % RE OINT: 1.0000 "application " | TOPICAL_OINTMENT | RECTAL | Status: DC | PRN

## 2015-03-02 MED ORDER — WITCH HAZEL-GLYCERIN EX PADS: 1.0000 "application " | MEDICATED_PAD | CUTANEOUS | Status: DC | PRN

## 2015-03-02 MED ORDER — SERTRALINE HCL 100 MG PO TABS
100.0000 mg | ORAL_TABLET | Freq: Every day | ORAL | Status: DC
  Administered 2015-03-02 – 2015-03-03 (×2): 100 mg via ORAL
  Filled 2015-03-02 (×3): qty 1

## 2015-03-02 MED ORDER — VITAMIN K1 1 MG/0.5ML IJ SOLN
INTRAMUSCULAR | Status: AC
  Filled 2015-03-02: qty 0.5

## 2015-03-02 MED ORDER — SENNOSIDES-DOCUSATE SODIUM 8.6-50 MG PO TABS
2.0000 | ORAL_TABLET | ORAL | Status: DC
  Administered 2015-03-02 – 2015-03-03 (×2): 2 via ORAL
  Filled 2015-03-02: qty 2

## 2015-03-02 MED ORDER — SIMETHICONE 80 MG PO CHEW: 80.0000 mg | CHEWABLE_TABLET | ORAL | Status: DC | PRN

## 2015-03-02 MED ORDER — RHO D IMMUNE GLOBULIN 1500 UNIT/2ML IJ SOSY
300.0000 ug | PREFILLED_SYRINGE | Freq: Once | INTRAMUSCULAR | Status: AC
  Administered 2015-03-02: 300 ug via INTRAMUSCULAR
  Filled 2015-03-02: qty 2

## 2015-03-02 MED ORDER — ZOLPIDEM TARTRATE 5 MG PO TABS: 5.0000 mg | ORAL_TABLET | Freq: Every evening | ORAL | Status: DC | PRN

## 2015-03-02 MED ORDER — LANOLIN HYDROUS EX OINT: TOPICAL_OINTMENT | CUTANEOUS | Status: DC | PRN

## 2015-03-02 MED ORDER — ACETAMINOPHEN 325 MG PO TABS: 650.0000 mg | ORAL_TABLET | ORAL | Status: DC | PRN

## 2015-03-02 MED ORDER — BENZOCAINE-MENTHOL 20-0.5 % EX AERO
1.0000 "application " | INHALATION_SPRAY | CUTANEOUS | Status: DC | PRN
  Administered 2015-03-02: 1 via TOPICAL
  Filled 2015-03-02: qty 56

## 2015-03-02 MED ORDER — PRENATAL MULTIVITAMIN CH
1.0000 | ORAL_TABLET | Freq: Every day | ORAL | Status: DC
  Administered 2015-03-02 – 2015-03-03 (×2): 1 via ORAL
  Filled 2015-03-02 (×2): qty 1

## 2015-03-02 MED ORDER — ONDANSETRON HCL 4 MG PO TABS: 4.0000 mg | ORAL_TABLET | ORAL | Status: DC | PRN

## 2015-03-02 NOTE — Anesthesia Postprocedure Evaluation (Signed)
  Anesthesia Post-op Note  Patient: Sara Reid  Procedure(s) Performed: * No procedures listed *  Patient Location: [facility]  Anesthesia Type:Epidural  Level of Consciousness: awake  Airway and Oxygen Therapy: Patient Spontanous Breathing  Post-op Pain: mild  Post-op Assessment: Patient's Cardiovascular Status Stable and Respiratory Function Stable              Post-op Vital Signs: stable  Last Vitals:  Filed Vitals:   03/02/15 0545  BP: 128/87  Pulse: 79  Temp: 36.7 C  Resp: 20    Complications: No apparent anesthesia complications

## 2015-03-02 NOTE — Progress Notes (Addendum)
Postpartum Note Day # 1  S:  Patient resting comfortable in bed.  Pain overall controlled.  Tolerating general. No flatus, no BM.  Lochia moderate.  Ambulating without difficulty.  She denies n/v/f/c, SOB, or CP.  Pt plans on breastfeeding.  O: Temp:  [98 F (36.7 C)-98.6 F (37 C)] 98.1 F (36.7 C) (08/26 0545) Pulse Rate:  [78-102] 79 (08/26 0545) Resp:  [16-20] 20 (08/26 0545) BP: (125-173)/(68-137) 128/87 mmHg (08/26 0545) SpO2:  [98 %-100 %] 100 % (08/26 0545) Gen: A&Ox3, NAD CV: RRR, no MRG Resp: CTAB Abdomen: soft, NT, ND Uterus: firm, non-tender, below umbilicus Ext: 1+ pitting edema, no calf tenderness bilaterally  Labs:  Recent Labs  03/01/15 1446 03/02/15 0430  HGB 10.3* 8.7*    A/P: Pt is a 29 y.o. G1P1001 s/p NSVD, PPD#1  - Pain well controlled -GU: Voiding freely -GI: Tolerating general diet -Activity: encouraged sitting up to chair and ambulation as tolerated -Prophylaxis: early ambulation -Labs: appropriate decline in Hgb, plan for iron daily -Gestational HTN: BP range since delivery 128-150/74-96, no BP medications needed, will continue to monitor -Depression/Anxiety: continue zoloft  daily -Continue routine postpartum care plan for discharge home tomorrow -Baby boy circ to be completed later today  Sara Hidalgo, DO [phone] (pager) [phone] (office)

## 2015-03-02 NOTE — Progress Notes (Signed)
Checked with Genelle post code event with her baby.  She said she was ok; nurse was present for continued care. Please page if support is needed.  Chaplain Elmarie Shiley Holder   03/02/15 1800  Clinical Encounter Type  Visited With Patient

## 2015-03-03 LAB — RH IG WORKUP (INCLUDES ABO/RH)
ABO/RH(D): A NEG
FETAL SCREEN: NEGATIVE
Gestational Age(Wks): 37.2
UNIT DIVISION: 0

## 2015-03-03 MED ORDER — IBUPROFEN 600 MG PO TABS: 600.0000 mg | ORAL_TABLET | Freq: Four times a day (QID) | ORAL | Status: DC

## 2015-03-03 NOTE — Discharge Instructions (Signed)
Vaginal Delivery, Care After °Refer to this sheet in the next few weeks. These discharge instructions provide you with information on caring for yourself after delivery. Your caregiver may also give you specific instructions. Your treatment has been planned according to the most current medical practices available, but problems sometimes occur. Call your caregiver if you have any problems or questions after you go home. °HOME CARE INSTRUCTIONS °· Take over-the-counter or prescription medicines only as directed by your caregiver or pharmacist. °· Do not drink alcohol, especially if you are breastfeeding or taking medicine to relieve pain. °· Do not chew or smoke tobacco. °· Do not use illegal drugs. °· Continue to use good perineal care. Good perineal care includes: °· Wiping your perineum from front to back. °· Keeping your perineum clean. °· Do not use tampons or douche until your caregiver says it is okay. °· Shower, wash your hair, and take tub baths as directed by your caregiver. °· Wear a well-fitting bra that provides breast support. °· Eat healthy foods. °· Drink enough fluids to keep your urine clear or pale yellow. °· Eat high-fiber foods such as whole grain cereals and breads, brown rice, beans, and fresh fruits and vegetables every day. These foods may help prevent or relieve constipation. °· Follow your caregiver's recommendations regarding resumption of activities such as climbing stairs, driving, lifting, exercising, or traveling. °· Talk to your caregiver about resuming sexual activities. Resumption of sexual activities is dependent upon your risk of infection, your rate of healing, and your comfort and desire to resume sexual activity. °· Try to have someone help you with your household activities and your newborn for at least a few days after you leave the hospital. °· Rest as much as possible. Try to rest or take a nap when your newborn is sleeping. °· Increase your activities gradually. °· Keep  all of your scheduled postpartum appointments. It is very important to keep your scheduled follow-up appointments. At these appointments, your caregiver will be checking to make sure that you are healing physically and emotionally. °SEEK MEDICAL CARE IF:  °· You are passing large clots from your vagina. Save any clots to show your caregiver. °· You have a foul smelling discharge from your vagina. °· You have trouble urinating. °· You are urinating frequently. °· You have pain when you urinate. °· You have a change in your bowel movements. °· You have increasing redness, pain, or swelling near your vaginal incision (episiotomy) or vaginal tear. °· You have pus draining from your episiotomy or vaginal tear. °· Your episiotomy or vaginal tear is separating. °· You have painful, hard, or reddened breasts. °· You have a severe headache. °· You have blurred vision or see spots. °· You feel sad or depressed. °· You have thoughts of hurting yourself or your newborn. °· You have questions about your care, the care of your newborn, or medicines. °· You are dizzy or light-headed. °· You have a rash. °· You have nausea or vomiting. °· You were breastfeeding and have not had a menstrual period within 12 weeks after you stopped breastfeeding. °· You are not breastfeeding and have not had a menstrual period by the 12th week after delivery. °· You have a fever. °SEEK IMMEDIATE MEDICAL CARE IF:  °· You have persistent pain. °· You have chest pain. °· You have shortness of breath. °· You faint. °· You have leg pain. °· You have stomach pain. °· Your vaginal bleeding saturates two or more sanitary pads   in 1 hour. °MAKE SURE YOU:  °· Understand these instructions. °· Will watch your condition. °· Will get help right away if you are not doing well or get worse. °Document Released: 06/20/2000 Document Revised: 11/07/2013 Document Reviewed: 02/18/2012 °ExitCare® Patient Information ©2015 [employer]. This information is not intended to  replace advice given to you by your health care provider. Make sure you discuss any questions you have with your health care provider. °Postpartum Depression and Baby Blues °The postpartum period begins right after the birth of a baby. During this time, there is often a great amount of joy and excitement. It is also a time of many changes in the life of the parents. Regardless of how many times a mother gives birth, each child brings new challenges and dynamics to the family. It is not unusual to have feelings of excitement along with confusing shifts in moods, emotions, and thoughts. All mothers are at risk of developing postpartum depression or the "baby blues." These mood changes can occur right after giving birth, or they may occur many months after giving birth. The baby blues or postpartum depression can be mild or severe. Additionally, postpartum depression can go away rather quickly, or it can be a long-term condition.  °CAUSES °Raised hormone levels and the rapid drop in those levels are thought to be a main cause of postpartum depression and the baby blues. A number of hormones change during and after pregnancy. Estrogen and progesterone usually decrease right after the delivery of your baby. The levels of thyroid hormone and various cortisol steroids also rapidly drop. Other factors that play a role in these mood changes include major life events and genetics.  °RISK FACTORS °If you have any of the following risks for the baby blues or postpartum depression, know what symptoms to watch out for during the postpartum period. Risk factors that may increase the likelihood of getting the baby blues or postpartum depression include: °· Having a personal or family history of depression.   °· Having depression while being pregnant.   °· Having premenstrual mood issues or mood issues related to oral contraceptives. °· Having a lot of life stress.   °· Having marital conflict.   °· Lacking a social support network.    °· Having a baby with special needs.   °· Having health problems, such as diabetes.   °SIGNS AND SYMPTOMS °Symptoms of baby blues include: °· Brief changes in mood, such as going from extreme happiness to sadness. °· Decreased concentration.   °· Difficulty sleeping.   °· Crying spells, tearfulness.   °· Irritability.   °· Anxiety.   °Symptoms of postpartum depression typically begin within the first month after giving birth. These symptoms include: °· Difficulty sleeping or excessive sleepiness.   °· Marked weight loss.   °· Agitation.   °· Feelings of worthlessness.   °· Lack of interest in activity or food.   °Postpartum psychosis is a very serious condition and can be dangerous. Fortunately, it is rare. Displaying any of the following symptoms is cause for immediate medical attention. Symptoms of postpartum psychosis include:  °· Hallucinations and delusions.   °· Bizarre or disorganized behavior.   °· Confusion or disorientation.   °DIAGNOSIS  °A diagnosis is made by an evaluation of your symptoms. There are no medical or lab tests that lead to a diagnosis, but there are various questionnaires that a health care provider may use to identify those with the baby blues, postpartum depression, or psychosis. Often, a screening tool called the Edinburgh Postnatal Depression Scale is used to diagnose depression in the postpartum period.  °  TREATMENT °The baby blues usually goes away on its own in 1-2 weeks. Social support is often all that is needed. You will be encouraged to get adequate sleep and rest. Occasionally, you may be given medicines to help you sleep.  °Postpartum depression requires treatment because it can last several months or longer if it is not treated. Treatment may include individual or group therapy, medicine, or both to address any social, physiological, and psychological factors that may play a role in the depression. Regular exercise, a healthy diet, rest, and social support may also be  strongly recommended.  °Postpartum psychosis is more serious and needs treatment right away. Hospitalization is often needed. °HOME CARE INSTRUCTIONS °· Get as much rest as you can. Nap when the baby sleeps.   °· Exercise regularly. Some women find yoga and walking to be beneficial.   °· Eat a balanced and nourishing diet.   °· Do little things that you enjoy. Have a cup of tea, take a bubble bath, read your favorite magazine, or listen to your favorite music. °· Avoid alcohol.   °· Ask for help with household chores, cooking, grocery shopping, or running errands as needed. Do not try to do everything.   °· Talk to people close to you about how you are feeling. Get support from your partner, family members, friends, or other new moms. °· Try to stay positive in how you think. Think about the things you are grateful for.   °· Do not spend a lot of time alone.   °· Only take over-the-counter or prescription medicine as directed by your health care provider. °· Keep all your postpartum appointments.   °· Let your health care provider know if you have any concerns.   °SEEK MEDICAL CARE IF: °You are having a reaction to or problems with your medicine. °SEEK IMMEDIATE MEDICAL CARE IF: °· You have suicidal feelings.   °· You think you may harm the baby or someone else. °MAKE SURE YOU: °· Understand these instructions. °· Will watch your condition. °· Will get help right away if you are not doing well or get worse. °Document Released: 03/27/2004 Document Revised: 06/28/2013 Document Reviewed: 04/04/2013 °ExitCare® Patient Information ©2015 [employer]. This information is not intended to replace advice given to you by your health care provider. Make sure you discuss any questions you have with your health care provider. ° °

## 2015-03-03 NOTE — Discharge Summary (Signed)
OB Discharge Summary  Patient Name: Sara Reid DOB: Sep 21, 1985 MRN: [id number]  Date of admission: 02/28/2015 Delivering MD: [name]   Date of discharge: No discharge date for patient encounter.  Admitting diagnosis: Induction of labor    Intrauterine pregnancy: [redacted]w[redacted]d     Secondary diagnosis: Gestational Hypertension and Gestational Diabetes medication controlled (A2)     Discharge diagnosis: Term Pregnancy Delivered  Method of delivery:      Information for the patient's newborn:  Armanii, Urbanik [[id number]]  Delivery Method: Vag-Spont                                                                                                     Post partum procedures:None  Augmentation: Pitocin and Cytotec  Complications:None  Hospital course:  Induction of Labor With Vaginal Delivery   29 y.o. yo G1P1001 at [redacted]w[redacted]d was admitted to the hospital 02/28/2015 for induction of labor.  Indication for induction: Gestational hypertension and A2 DM.  Patient had an uncomplicated labor course as follows:  Mediations and procedures used include: Cytotec and Pitocin      Length of stage 3:                                       Patient had delivery of a Viable infant.  Information for the patient's newborn:  Amiyrah, Lamere [[id number]]  Delivery Method: Vag-Spont   03/01/2015  Details of delivery can be found in separate delivery note.  Patient had a routine postpartum course. Patient is discharged home No discharge date for patient encounter.    Physical exam  Filed Vitals:   03/02/15 0545 03/02/15 1400 03/02/15 2202 03/03/15 0508  BP: 128/87 131/70 136/78 138/79  Pulse: 79 83 92 71  Temp: 98.1 F (36.7 C) 97.9 F (36.6 C) 98.6 F (37 C) 97.8 F (36.6 C)  TempSrc: Oral Oral Oral Oral  Resp: Height:      Weight:      SpO2: 100% 99% 100% 100%   General: alert, cooperative and no distress Lochia: appropriate Uterine Fundus:  firm Incision: N/A DVT Evaluation: No evidence of DVT seen on physical exam. Calf/Ankle edema is present Labs: Lab Results  Component Value Date   WBC 8.3 03/02/2015   HGB 8.7* 03/02/2015   HCT 25.4* 03/02/2015   MCV 89.4 03/02/2015   PLT 170 03/02/2015   CMP Latest Ref Rng 02/28/2015  Glucose 65 - 99 mg/dL 84  BUN 6 - 20 mg/dL 12  Creatinine 2.84 - 1.32 mg/dL 4.40  Sodium 102 - 725 mmol/L 133(L)  Potassium 3.5 - 5.1 mmol/L 3.8  Chloride 101 - 111 mmol/L 106  CO2 22 - 32 mmol/L 19(L)  Calcium 8.9 - 10.3 mg/dL 9.5  Total Protein 6.5 - 8.1 g/dL 3.6(U)  Total Bilirubin 0.3 - 1.2 mg/dL 0.5  Alkaline Phos 38 - 126 U/L 157(H)  AST 15 - 41 U/L 31  ALT 14 - 54 U/L 24  Discharge instruction: per After Visit Summary and "Baby and Me Booklet".  Medications:   Medication List    TAKE these medications        acetaminophen 500 MG tablet  Commonly known as:  TYLENOL  Take 1,000 mg by mouth every 6 (six) hours as needed for mild pain or headache.     calcium carbonate 500 MG chewable tablet  Commonly known as:  TUMS - dosed in mg elemental calcium  Chew 2 tablets by mouth daily.     ibuprofen 600 MG tablet  Commonly known as:  ADVIL,MOTRIN  Take 1 tablet (600 mg total) by mouth every 6 (six) hours.     IRON PO  Take 1 tablet by mouth daily.     lisdexamfetamine 30 MG capsule  Commonly known as:  VYVANSE  Take 1 capsule (30 mg total) by mouth daily. Lower dose as pregnant. May fill 04/26/15     ondansetron 4 MG tablet  Commonly known as:  ZOFRAN  Take 4 mg by mouth every 8 (eight) hours as needed for nausea.     prenatal multivitamin Tabs tablet  Take 2 tablets by mouth daily at 12 noon.     sertraline 100 MG tablet  Commonly known as:  ZOLOFT  Take 1 tablet (100 mg total) by mouth daily.        Diet: carb modified diet  Activity: Advance as tolerated. Pelvic rest for 6 weeks.   Outpatient follow up:1 week  Postpartum contraception: Unsure, considering  IUD.  Newborn Data: Live born female  Birth Weight: 7 lb 5.5 oz (3331 g) APGAR: 9, 9  Baby Feeding: Breast Disposition:home with mother Circumcision done prior to discharge. Code APGAR called on DOL #2.  Baby dusky and blue likely from vomiting.  Upon arrival of NICU team baby had improved.  Observed in Nursery 2-3 hours, no other episodes observed.  03/03/2015 Geryl Rankins, MD

## 2015-03-03 NOTE — Progress Notes (Signed)
Discharge teaching complete. Pt understood all information and did not have any questions. Pt ambulated out of the hospital and discharged home to family.  

## 2015-05-18 ENCOUNTER — Ambulatory Visit: Payer: PRIVATE HEALTH INSURANCE | Admitting: Family Medicine

## 2015-05-25 ENCOUNTER — Encounter: Payer: Self-pay | Admitting: Family Medicine

## 2015-05-25 ENCOUNTER — Ambulatory Visit (INDEPENDENT_AMBULATORY_CARE_PROVIDER_SITE_OTHER): Payer: PRIVATE HEALTH INSURANCE | Admitting: Family Medicine

## 2015-05-25 VITALS — BP 118/70 | HR 102 | Temp 99.0°F | Wt 221.0 lb

## 2015-05-25 DIAGNOSIS — O24410 Gestational diabetes mellitus in pregnancy, diet controlled: Secondary | ICD-10-CM | POA: Diagnosis not present

## 2015-05-25 DIAGNOSIS — Z720 Tobacco use: Secondary | ICD-10-CM | POA: Diagnosis not present

## 2015-05-25 DIAGNOSIS — F329 Major depressive disorder, single episode, unspecified: Secondary | ICD-10-CM

## 2015-05-25 DIAGNOSIS — F909 Attention-deficit hyperactivity disorder, unspecified type: Secondary | ICD-10-CM

## 2015-05-25 DIAGNOSIS — Z23 Encounter for immunization: Secondary | ICD-10-CM

## 2015-05-25 DIAGNOSIS — F988 Other specified behavioral and emotional disorders with onset usually occurring in childhood and adolescence: Secondary | ICD-10-CM

## 2015-05-25 DIAGNOSIS — F32A Depression, unspecified: Secondary | ICD-10-CM

## 2015-05-25 MED ORDER — LISDEXAMFETAMINE DIMESYLATE 50 MG PO CAPS: 50.0000 mg | ORAL_CAPSULE | Freq: Every day | ORAL | Status: DC

## 2015-05-25 MED ORDER — SERTRALINE HCL 100 MG PO TABS: 100.0000 mg | ORAL_TABLET | Freq: Every day | ORAL | Status: DC

## 2015-05-25 NOTE — Progress Notes (Signed)
Tana ConchStephen Hunter, MD  Subjective:  Sara Reid is a 29 y.o. year old very pleasant female patient who presents for/with See problem oriented charting ROS- No chest pain or shortness of breath. No headache or blurry vision. No unintentional weight los.   Past Medical History-  Patient Active Problem List   Diagnosis Date Noted  . Tobacco abuse 07/12/2014    Priority: High  . Attention deficit disorder 10/02/2009    Priority: High  . Gestational diabetes 02/15/2015    Priority: Medium  . PCOS (polycystic ovarian syndrome) 10/02/2009    Priority: Medium  . Depression 10/02/2009    Priority: Medium  . Pruritic rash 04/08/2013    Priority: Low  . 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 calcium carbonate (TUMS - DOSED IN MG ELEMENTAL CALCIUM) 500 MG chewable tablet Chew 2 tablets by mouth daily.    . IRON PO Take 1 tablet by mouth daily.    Marland Kitchen. lisdexamfetamine (VYVANSE) 50 MG capsule Take 1 capsule (50 mg total) by mouth daily. 30 capsule 0  . sertraline (ZOLOFT) 100 MG tablet Take 1 tablet (100 mg total) by mouth daily. 90 tablet 3  . acetaminophen (TYLENOL) 500 MG tablet Take 1,000 mg by mouth every 6 (six) hours as needed for mild pain or headache.    . ibuprofen (ADVIL,MOTRIN) 600 MG tablet Take 1 tablet (600 mg total) by mouth every 6 (six) hours. (Patient not taking: Reported on 05/25/2015) 30 tablet 1  . lisdexamfetamine (VYVANSE) 50 MG capsule Take 1 capsule (50 mg total) by mouth daily. May fill in 1 month 30 capsule 0  . lisdexamfetamine (VYVANSE) 50 MG capsule Take 1 capsule (50 mg total) by mouth daily. May fill in 2 months 30 capsule 0   No current facility-administered medications for this visit.    Objective: BP 118/70 mmHg  Pulse 102  Temp(Src) 99 F (37.2 C)  Wt 221 lb (100.245 kg) Gen: NAD, resting comfortably CV: RRR no murmurs rubs or  gallops Lungs: CTAB no crackles, wheeze, rhonchi Abdomen: soft/nontender/nondistended/normal bowel sounds. No rebound or guarding.  Ext: no edema Skin: warm, dry Neuro: grossly normal, moves all extremities  Assessment/Plan:  Attention deficit disorder S: some difficulty on 30mg  dose (used in pregnacy). Prior was on 70mg . Working 3 days a week- rarely uses when at home. She is no longer breastfeeding. Doing reasonably well with attention at home but does struggle at times.  A/P: we will trial 50mg  vyvanse x 3 months. Consider titration upwards if needed - would prefer to see patient in office for discussion.    Tobacco abuse 5 cigarettes a day- encouraged complete cessation (patient declines)  Gestational diabetes Consider a1c next visit  Depression S: controlled on zoloft 100mg . Fortunately no postpartum issues. No SI/HI A/P: continue current rx- refilled    Return precautions advised.   Orders Placed This Encounter  Procedures  . Flu Vaccine QUAD 36+ mos IM    Meds ordered this encounter  Medications  . lisdexamfetamine (VYVANSE) 50 MG capsule    Sig: Take 1 capsule (50 mg total) by mouth daily.    Dispense:  30 capsule    Refill:  0  . lisdexamfetamine (VYVANSE) 50 MG capsule    Sig: Take 1 capsule (50 mg total) by mouth daily. May fill in 1 month    Dispense:  30 capsule    Refill:  0  . lisdexamfetamine (VYVANSE) 50 MG capsule    Sig: Take 1 capsule (50 mg total) by mouth daily. May fill in 2 months    Dispense:  30 capsule    Refill:  0  . sertraline (ZOLOFT) 100 MG tablet    Sig: Take 1 tablet (100 mg total) by mouth daily.    Dispense:  90 tablet    Refill:  3

## 2015-05-25 NOTE — Patient Instructions (Addendum)
Flu shot received today.  May call for 1 refill within 6 months but would need to see you for refill after that  Vyvanse Change to 50mg - let us know if intolerable side effects. See me in 3-4 months if symptoms not controlled and could further adjust

## 2015-05-26 ENCOUNTER — Encounter: Payer: Self-pay | Admitting: Family Medicine

## 2015-05-26 NOTE — Assessment & Plan Note (Signed)
5 cigarettes a day- encouraged complete cessation (patient declines)

## 2015-05-26 NOTE — Assessment & Plan Note (Signed)
S: some difficulty on 30mg  dose (used in pregnacy). Prior was on 70mg . Working 3 days a week- rarely uses when at home. She is no longer breastfeeding. Doing reasonably well with attention at home but does struggle at times.  A/P: we will trial 50mg  vyvanse x 3 months. Consider titration upwards if needed - would prefer to see patient in office for discussion.

## 2015-05-26 NOTE — Assessment & Plan Note (Signed)
Consider a1c next visit

## 2015-05-26 NOTE — Assessment & Plan Note (Signed)
S: controlled on zoloft 100mg . Fortunately no postpartum issues. No SI/HI A/P: continue current rx- refilled

## 2015-08-27 ENCOUNTER — Telehealth: Payer: Self-pay | Admitting: Family Medicine

## 2015-08-27 MED ORDER — LISDEXAMFETAMINE DIMESYLATE 50 MG PO CAPS: 50.0000 mg | ORAL_CAPSULE | Freq: Every day | ORAL | Status: DC

## 2015-08-27 NOTE — Telephone Encounter (Signed)
Per your last note: May call for 1 refill within 6 months but would need to see you for refill after that.  Is this still ok?

## 2015-08-27 NOTE — Telephone Encounter (Signed)
Pt request refill  lisdexamfetamine (VYVANSE) 50 MG capsule 3 mo supply

## 2015-08-27 NOTE — Telephone Encounter (Signed)
May fill 3 months. Needs appointment for next round of refills

## 2015-08-27 NOTE — Telephone Encounter (Signed)
Called and lm on pt vm that Rx upfront ready for pick and to schedule appointment in order to get next set of refills.

## 2015-09-27 ENCOUNTER — Other Ambulatory Visit: Payer: Self-pay | Admitting: Family Medicine

## 2015-10-28 ENCOUNTER — Other Ambulatory Visit: Payer: Self-pay | Admitting: Family Medicine

## 2015-11-26 ENCOUNTER — Telehealth: Payer: Self-pay | Admitting: Family Medicine

## 2015-11-26 ENCOUNTER — Other Ambulatory Visit: Payer: Self-pay | Admitting: Pediatrics

## 2015-11-26 MED ORDER — SERTRALINE HCL 100 MG PO TABS: ORAL_TABLET | ORAL | Status: DC

## 2015-11-26 NOTE — Telephone Encounter (Signed)
Pt aware she needs appointment.   However, pt only day off is Monday and Friday. Pt has one week med left. Ok to work in this Friday? Only same day appointments left. We are closed Monday. 15 or 30 min if ok to use same day?

## 2015-11-26 NOTE — Telephone Encounter (Signed)
Use 11:15 slot- can leave as 15 minutes.  If she cannot make this- will have to just be without medicine until she can get in. The plan should be that she schedule a 6 month visit when she comes in each time.

## 2015-11-26 NOTE — Telephone Encounter (Signed)
Refills for Zoloft sent to pharmacy.  Pt muse have appt before Vyvanse rx given per Dr Durene CalHunter.  Left message for t to call back.

## 2015-11-26 NOTE — Telephone Encounter (Signed)
Yes thanks, can refill zoloft.   Vyvanse- has to have visit as has been 6 months

## 2015-11-26 NOTE — Telephone Encounter (Signed)
Pt requesting refill on Zoloft, and rxs for Vyvanse-3 month supply. Please advise.

## 2015-11-26 NOTE — Telephone Encounter (Signed)
Pt request refill  lisdexamfetamine (VYVANSE) 50 MG capsule 3 mo supply  ALSO  Pt request refill  sertraline (ZOLOFT) 100 MG tablet  [facility]

## 2015-11-27 NOTE — Telephone Encounter (Signed)
I apologize. I was in the process of scheduling the appt.  It is complete now, she states she will be here for appt.

## 2015-11-27 NOTE — Telephone Encounter (Signed)
Pt was advised of the appt time and date. She was also advised the rx for Vyvanse would not be given until appt kept. She voiced understanding. FYI

## 2015-11-27 NOTE — Telephone Encounter (Signed)
Patient did not take the appointment - she is not scheduled I mean. Just want to clarify- did she decline appointment or did she say she would call back to schedule?

## 2015-11-30 ENCOUNTER — Ambulatory Visit (INDEPENDENT_AMBULATORY_CARE_PROVIDER_SITE_OTHER): Payer: PRIVATE HEALTH INSURANCE | Admitting: Family Medicine

## 2015-11-30 VITALS — BP 122/74 | HR 102 | Ht 70.0 in | Wt 220.0 lb

## 2015-11-30 DIAGNOSIS — F329 Major depressive disorder, single episode, unspecified: Secondary | ICD-10-CM

## 2015-11-30 DIAGNOSIS — F988 Other specified behavioral and emotional disorders with onset usually occurring in childhood and adolescence: Secondary | ICD-10-CM

## 2015-11-30 DIAGNOSIS — F32A Depression, unspecified: Secondary | ICD-10-CM

## 2015-11-30 DIAGNOSIS — F909 Attention-deficit hyperactivity disorder, unspecified type: Secondary | ICD-10-CM

## 2015-11-30 MED ORDER — LISDEXAMFETAMINE DIMESYLATE 50 MG PO CAPS: 50.0000 mg | ORAL_CAPSULE | Freq: Every day | ORAL | Status: DC

## 2015-11-30 NOTE — Assessment & Plan Note (Signed)
S: some difficulty on 30mg  dose (used in pregnacy). Prior was on 70mg . Last visit we increased to 50mg  due to some difficulties when Working 3 days a week- rarely used when at home only. Not breastfeeding. She thinks she is doing well on 50mg  dose.   A/P: continue  50mg  vyvanse x 3 months, call in 3 months for refill, see in 6 months

## 2015-11-30 NOTE — Patient Instructions (Signed)
May call for 1 refill within 6 months but would need to see you for refill after that. Reasonable to schedule 6 month follow up today.

## 2015-11-30 NOTE — Assessment & Plan Note (Signed)
S: phq2 of 0, stable on zoloft 100mg . No SI/HI A/P: continue current medication- doing well

## 2015-11-30 NOTE — Progress Notes (Signed)
Subjective:  Sara Reid is a 30 y.o. year old very pleasant female patient who presents for/with See problem oriented charting ROS- no chest pain or shortness of breath or palpitations.see any ROS included in HPI as well.   Past Medical History-  Patient Active Problem List   Diagnosis Date Noted  . Tobacco abuse 07/12/2014    Priority: High  . Attention deficit disorder 10/02/2009    Priority: High  . Gestational diabetes 02/15/2015    Priority: Medium  . PCOS (polycystic ovarian syndrome) 10/02/2009    Priority: Medium  . Depression 10/02/2009    Priority: Medium  . Pruritic rash 04/08/2013    Priority: Low  . 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 acetaminophen (TYLENOL) 500 MG tablet Take 1,000 mg by mouth every 6 (six) hours as needed for mild pain or headache.    . ibuprofen (ADVIL,MOTRIN) 600 MG tablet Take 1 tablet (600 mg total) by mouth every 6 (six) hours. 30 tablet 1  . IRON PO Take 1 tablet by mouth daily.    Marland Kitchen. lisdexamfetamine (VYVANSE) 50 MG capsule Take 1 capsule (50 mg total) by mouth daily. 30 capsule 0  . sertraline (ZOLOFT) 100 MG tablet TAKE 1 TABLET (100 MG TOTAL) BY MOUTH DAILY. 90 tablet 0  . lisdexamfetamine (VYVANSE) 50 MG capsule Take 1 capsule (50 mg total) by mouth daily. May fill in 2 months 30 capsule 0  . lisdexamfetamine (VYVANSE) 50 MG capsule Take 1 capsule (50 mg total) by mouth daily. May fill in 1 month 30 capsule 0   No current facility-administered medications for this visit.    Objective: BP 122/74 mmHg  Pulse 102  Ht 5\' 10"  (1.778 m)  Wt 220 lb (99.791 kg)  BMI 31.57 kg/m2  SpO2 97% Gen: NAD, resting comfortably  Assessment/Plan:  Attention deficit disorder S: some difficulty on 30mg  dose (used in pregnacy). Prior was on 70mg . Last visit we increased to 50mg  due to some difficulties when Working 3 days a  week- rarely used when at home only. Not breastfeeding. She thinks she is doing well on 50mg  dose.   A/P: continue  50mg  vyvanse x 3 months, call in 3 months for refill, see in 6 months  Depression S: phq2 of 0, stable on zoloft 100mg . No SI/HI A/P: continue current medication- doing well   6 months.  Return precautions advised.    Meds ordered this encounter  Medications  . lisdexamfetamine (VYVANSE) 50 MG capsule    Sig: Take 1 capsule (50 mg total) by mouth daily. May fill in 2 months    Dispense:  30 capsule    Refill:  0  . lisdexamfetamine (VYVANSE) 50 MG capsule    Sig: Take 1 capsule (50 mg total) by mouth daily. May fill in 1 month    Dispense:  30 capsule    Refill:  0  . lisdexamfetamine (VYVANSE) 50 MG capsule    Sig: Take 1 capsule (50 mg total) by mouth daily.    Dispense:  30 capsule    Refill:  0    May fill today   The duration of face-to-face time during this visit was 15 minutes. Greater than 50% of this time was spent in counseling, explanation of diagnosis, planning of further management, and/or coordination of care.     Tana ConchStephen Hunter, MD

## 2016-02-25 ENCOUNTER — Other Ambulatory Visit: Payer: Self-pay | Admitting: Family Medicine

## 2016-02-26 ENCOUNTER — Telehealth: Payer: Self-pay | Admitting: Family Medicine

## 2016-02-26 ENCOUNTER — Other Ambulatory Visit: Payer: Self-pay

## 2016-02-26 MED ORDER — LISDEXAMFETAMINE DIMESYLATE 50 MG PO CAPS: 50.0000 mg | ORAL_CAPSULE | Freq: Every day | ORAL | 0 refills | Status: DC

## 2016-02-26 NOTE — Telephone Encounter (Signed)
Yes thanks 

## 2016-02-26 NOTE — Telephone Encounter (Signed)
Prescriptions printed and placed up front for pickup. Called patient and left her a voicemail letting her know they could be picked up at the front desk at her convenience.

## 2016-02-26 NOTE — Telephone Encounter (Signed)
Pt needs new rx vyvanse 50 mg °

## 2016-05-26 ENCOUNTER — Telehealth: Payer: Self-pay | Admitting: Family Medicine

## 2016-05-26 NOTE — Telephone Encounter (Signed)
° °  Pt request refill of the following:  lisdexamfetamine (VYVANSE) 50 MG capsule  sertraline (ZOLOFT) 100 MG tablet      Phamacy:

## 2016-05-26 NOTE — Telephone Encounter (Signed)
Pt scheduled for 05/27/2016 at 4:00pm.

## 2016-05-26 NOTE — Telephone Encounter (Signed)
Called and left patient a message asking her to return my phone call. It has been 6 months since she has been seen so she needs an appointment in order to refill these prescriptions.

## 2016-05-27 ENCOUNTER — Encounter: Payer: Self-pay | Admitting: Family Medicine

## 2016-05-27 ENCOUNTER — Ambulatory Visit (INDEPENDENT_AMBULATORY_CARE_PROVIDER_SITE_OTHER): Payer: No Typology Code available for payment source | Admitting: Family Medicine

## 2016-05-27 VITALS — BP 108/82 | HR 100 | Temp 98.8°F | Ht 70.0 in | Wt 243.6 lb

## 2016-05-27 DIAGNOSIS — Z23 Encounter for immunization: Secondary | ICD-10-CM

## 2016-05-27 DIAGNOSIS — F325 Major depressive disorder, single episode, in full remission: Secondary | ICD-10-CM | POA: Diagnosis not present

## 2016-05-27 DIAGNOSIS — Z720 Tobacco use: Secondary | ICD-10-CM

## 2016-05-27 DIAGNOSIS — F988 Other specified behavioral and emotional disorders with onset usually occurring in childhood and adolescence: Secondary | ICD-10-CM | POA: Diagnosis not present

## 2016-05-27 MED ORDER — LISDEXAMFETAMINE DIMESYLATE 50 MG PO CAPS
50.0000 mg | ORAL_CAPSULE | Freq: Every day | ORAL | 0 refills | Status: DC
Start: 2016-05-27 — End: 2016-08-28

## 2016-05-27 MED ORDER — LISDEXAMFETAMINE DIMESYLATE 50 MG PO CAPS: 50.0000 mg | ORAL_CAPSULE | Freq: Every day | ORAL | 0 refills | Status: DC

## 2016-05-27 NOTE — Assessment & Plan Note (Signed)
S: 1 ppd  A/P: not ready to quit. Strongly advised cessation.  chantix- horrible dreams. Could consider patch/gum if needed.

## 2016-05-27 NOTE — Progress Notes (Signed)
Pre visit review using our clinic review tool, if applicable. No additional management support is needed unless otherwise documented below in the visit note. 

## 2016-05-27 NOTE — Assessment & Plan Note (Signed)
S:phq2 of 0. No si. Compliant with zoloft 100mg  A/P: continue current medications

## 2016-05-27 NOTE — Patient Instructions (Signed)
May call for 1 refill within 6 months. Glad this is working well for you  Encourage you to quit smoking- one of best things you can do for health- with risks of vyvanse in long run to heart health- cannot continue both this and smoking likely past age 30  In 6 months, see me for a medical physical

## 2016-05-27 NOTE — Assessment & Plan Note (Addendum)
S: patient's add has been well controlled on vyvanse 50mg  without side effects. No working 4 days a week (30 hours) A/P: refilled medication 3 months, physical in 6 months, call in 3 months.  We discussed that if patient were to get pregnant again that I would no longer prescribe vyvanse- would advise her to see psychiatry if that occurred to have conversation between her and GYN. There has been some more information released about add in pregnancy from my recollection since prior pregnancy (son 15 months). [facility] next visit

## 2016-05-27 NOTE — Progress Notes (Signed)
Subjective:  Sara Reid is a 30 y.o. year old very pleasant female patient who presents for/with See problem oriented charting ROS- no unintentional weight loss. No chest pain or palpitaitions.see any ROS included in HPI as well.   Past Medical History-  Patient Active Problem List   Diagnosis Date Noted  . Tobacco abuse 07/12/2014    Priority: High  . Attention deficit disorder (ADD) in adult 10/02/2009    Priority: High  . History of gestational diabetes 02/15/2015    Priority: Medium  . PCOS (polycystic ovarian syndrome) 10/02/2009    Priority: Medium  . Depression 10/02/2009    Priority: Medium  . Pruritic rash 04/08/2013    Priority: Low  . 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 acetaminophen (TYLENOL) 500 MG tablet Take 1,000 mg by mouth every 6 (six) hours as needed for mild pain or headache.    . ibuprofen (ADVIL,MOTRIN) 600 MG tablet Take 1 tablet (600 mg total) by mouth every 6 (six) hours. 30 tablet 1  . lisdexamfetamine (VYVANSE) 50 MG capsule Take 1 capsule (50 mg total) by mouth daily. May fill in 2 months 30 capsule 0  . lisdexamfetamine (VYVANSE) 50 MG capsule Take 1 capsule (50 mg total) by mouth daily. May fill in 1 month 30 capsule 0  . lisdexamfetamine (VYVANSE) 50 MG capsule Take 1 capsule (50 mg total) by mouth daily. 30 capsule 0  . sertraline (ZOLOFT) 100 MG tablet TAKE 1 TABLET BY MOUTH EVERY DAY 90 tablet 1   No current facility-administered medications for this visit.     Objective: BP 108/82 (BP Location: Left Arm, Patient Position: Sitting, Cuff Size: Large)   Pulse 100   Temp 98.8 F (37.1 C) (Oral)   Ht 5\' 10"  (1.778 m)   Wt 243 lb 9.6 oz (110.5 kg)   LMP 05/07/2016   SpO2 97%   BMI 34.95 kg/m  Gen: NAD, resting comfortably CV: RRR no murmurs rubs or gallops Lungs: CTAB no crackles, wheeze, rhonchi Abdomen:  soft/nontender/obese  Assessment/Plan:  Attention deficit disorder (ADD) in adult S: patient's add has been well controlled on vyvanse 50mg  without side effects. No working 4 days a week (30 hours) A/P: refilled medication 3 months, physical in 6 months, call in 3 months.  We discussed that if patient were to get pregnant again that I would no longer prescribe vyvanse- would advise her to see psychiatry if that occurred to have conversation between her and GYN. There has been some more information released about add in pregnancy from my recollection since prior pregnancy (son 15 months). Need to get records from [facility] next visit  Tobacco abuse S: 1 ppd  A/P: not ready to quit. Strongly advised cessation.  chantix- horrible dreams. Could consider patch/gum if needed.   Depression S:phq2 of 0. No si. Compliant with zoloft 100mg  A/P: continue current medications  6 month medical CPE  Orders Placed This Encounter  Procedures  . Flu Vaccine QUAD 36+ mos IM    Meds ordered this encounter  Medications  . lisdexamfetamine (VYVANSE) 50 MG capsule    Sig: Take 1 capsule (50 mg total) by mouth daily. May fill in 2 months    Dispense:  30 capsule    Refill:  0  . lisdexamfetamine (VYVANSE) 50 MG capsule    Sig: Take 1 capsule (50 mg total) by mouth daily. May fill  in 1 month    Dispense:  30 capsule    Refill:  0  . lisdexamfetamine (VYVANSE) 50 MG capsule    Sig: Take 1 capsule (50 mg total) by mouth daily.    Dispense:  30 capsule    Refill:  0    May fill today    Return precautions advised.  Tana ConchStephen Shandria Clinch, MD

## 2016-08-21 ENCOUNTER — Other Ambulatory Visit: Payer: Self-pay | Admitting: Family Medicine

## 2016-08-25 ENCOUNTER — Telehealth: Payer: Self-pay | Admitting: Family Medicine

## 2016-08-25 NOTE — Telephone Encounter (Signed)
Pt is need refill for lisdexamfetamine (VYVANSE) 50 MG capsule.

## 2016-08-27 NOTE — Telephone Encounter (Signed)
Pt states she is really struggling at work and hopes to get rx asap

## 2016-08-27 NOTE — Telephone Encounter (Signed)
Pt calling to check the status of her Rx and would like to get it today.

## 2016-08-28 ENCOUNTER — Other Ambulatory Visit: Payer: Self-pay

## 2016-08-28 MED ORDER — LISDEXAMFETAMINE DIMESYLATE 50 MG PO CAPS: 50.0000 mg | ORAL_CAPSULE | Freq: Every day | ORAL | 0 refills | Status: DC

## 2016-08-28 NOTE — Telephone Encounter (Signed)
Prescriptions printed and placed up front for pickup

## 2016-11-26 ENCOUNTER — Ambulatory Visit (INDEPENDENT_AMBULATORY_CARE_PROVIDER_SITE_OTHER): Payer: No Typology Code available for payment source | Admitting: Family Medicine

## 2016-11-26 ENCOUNTER — Encounter: Payer: Self-pay | Admitting: Family Medicine

## 2016-11-26 VITALS — BP 108/76 | HR 92 | Temp 98.5°F | Ht 69.75 in | Wt 244.4 lb

## 2016-11-26 DIAGNOSIS — Z720 Tobacco use: Secondary | ICD-10-CM

## 2016-11-26 DIAGNOSIS — F988 Other specified behavioral and emotional disorders with onset usually occurring in childhood and adolescence: Secondary | ICD-10-CM | POA: Diagnosis not present

## 2016-11-26 DIAGNOSIS — Z0000 Encounter for general adult medical examination without abnormal findings: Secondary | ICD-10-CM

## 2016-11-26 DIAGNOSIS — Z79899 Other long term (current) drug therapy: Secondary | ICD-10-CM | POA: Diagnosis not present

## 2016-11-26 DIAGNOSIS — E6609 Other obesity due to excess calories: Secondary | ICD-10-CM | POA: Diagnosis not present

## 2016-11-26 DIAGNOSIS — E669 Obesity, unspecified: Secondary | ICD-10-CM | POA: Insufficient documentation

## 2016-11-26 LAB — CBC
HCT: 41.1 % (ref 36.0–46.0)
HEMOGLOBIN: 14.1 g/dL (ref 12.0–15.0)
MCHC: 34.4 g/dL (ref 30.0–36.0)
MCV: 89.6 fl (ref 78.0–100.0)
Platelets: 286 10*3/uL (ref 150.0–400.0)
RBC: 4.59 Mil/uL (ref 3.87–5.11)
RDW: 13.5 % (ref 11.5–15.5)
WBC: 6.6 10*3/uL (ref 4.0–10.5)

## 2016-11-26 LAB — LIPID PANEL
Cholesterol: 204 mg/dL — ABNORMAL HIGH (ref 0–200)
HDL: 43.5 mg/dL (ref >39.00)
LDL Cholesterol: 135 mg/dL — ABNORMAL HIGH (ref 0–99)
NONHDL: 160.3
Total CHOL/HDL Ratio: 5
Triglycerides: 127 mg/dL (ref 0.0–149.0)
VLDL: 25.4 mg/dL (ref 0.0–40.0)

## 2016-11-26 LAB — COMPREHENSIVE METABOLIC PANEL
ALT: 23 U/L (ref 0–35)
AST: 18 U/L (ref 0–37)
Albumin: 4.4 g/dL (ref 3.5–5.2)
Alkaline Phosphatase: 73 U/L (ref 39–117)
BILIRUBIN TOTAL: 0.4 mg/dL (ref 0.2–1.2)
BUN: 16 mg/dL (ref 6–23)
CO2: 21 meq/L (ref 19–32)
Calcium: 9.2 mg/dL (ref 8.4–10.5)
Chloride: 108 mEq/L (ref 96–112)
Creatinine, Ser: 0.63 mg/dL (ref 0.40–1.20)
GFR: 117.27 mL/min (ref >60.00)
GLUCOSE: 109 mg/dL — AB (ref 70–99)
Potassium: 4.5 mEq/L (ref 3.5–5.1)
SODIUM: 137 meq/L (ref 135–145)
Total Protein: 7 g/dL (ref 6.0–8.3)

## 2016-11-26 LAB — HEMOGLOBIN A1C: HEMOGLOBIN A1C: 6.3 % (ref 4.6–6.5)

## 2016-11-26 MED ORDER — LISDEXAMFETAMINE DIMESYLATE 50 MG PO CAPS: 50.0000 mg | ORAL_CAPSULE | Freq: Every day | ORAL | 0 refills | Status: DC

## 2016-11-26 NOTE — Assessment & Plan Note (Signed)
Tobacco abuse- still 1 PPD- we discussed today if she is still smoking at age 31 then would discontinue ADD treatments- we need to lower her cardiac risk if going to continue ADD meds into later adulthood

## 2016-11-26 NOTE — Progress Notes (Signed)
Phone: [phone]  Subjective:  Patient presents today for their annual physical. Chief complaint-noted.   See problem oriented charting- ROS- full  review of systems was completed and negative including No chest pain or shortness of breath. No headache or blurry vision.   The following were reviewed and entered/updated in epic: Past Medical History:  Diagnosis Date  . Depression   . GDM (gestational diabetes mellitus)   . Kidney stone complicating pregnancy 02/16/2015  . Neonatal jaundice   . UTI (lower urinary tract infection)    Patient Active Problem List   Diagnosis Date Noted  . Tobacco abuse 07/12/2014    Priority: High  . Attention deficit disorder (ADD) in adult 10/02/2009    Priority: High  . History of gestational diabetes 02/15/2015    Priority: Medium  . PCOS (polycystic ovarian syndrome) 10/02/2009    Priority: Medium  . Depression 10/02/2009    Priority: Medium  . Pruritic rash 04/08/2013    Priority: Low  . 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 Obesity 11/26/2016   Past Surgical History:  Procedure Laterality Date  . BREAST BIOPSY     benign    Family History  Problem Relation Age of Onset  . Hyperlipidemia Mother   . Depression Mother        anxiety  . Hyperlipidemia Father     Medications- reviewed and updated Current Outpatient Prescriptions  Medication Sig Dispense Refill  . acetaminophen (TYLENOL) 500 MG tablet Take 1,000 mg by mouth every 6 (six) hours as needed for mild pain or headache.    . ibuprofen (ADVIL,MOTRIN) 600 MG tablet Take 1 tablet (600 mg total) by mouth every 6 (six) hours. 30 tablet 1  . lisdexamfetamine (VYVANSE) 50 MG capsule Take 1 capsule (50 mg total) by mouth daily. May fill in 60 days 30 capsule 0  . lisdexamfetamine (VYVANSE) 50 MG capsule Take 1 capsule (50 mg total) by mouth daily. May fill in 30 days 30 capsule 0  . lisdexamfetamine (VYVANSE) 50 MG capsule Take 1 capsule (50 mg total) by mouth daily. 30  capsule 0  . sertraline (ZOLOFT) 100 MG tablet TAKE 1 TABLET BY MOUTH EVERY DAY 90 tablet 1   No current facility-administered medications for this visit.     Allergies-reviewed and updated Allergies  Allergen Reactions  . Amoxicillin-Pot Clavulanate Hives and Rash  . Ceclor [Cefaclor] Hives and Rash  . Eryzole Hives and Rash    Pediazole    Social History   Social History  . Marital status: Married    Spouse name: N/A  . Number of children: N/A  . Years of education: N/A   Occupational History  . part time Forensic scientist   Social History Main Topics  . Smoking status: Current Every Day Smoker    Packs/day: 1.00  . Smokeless tobacco: Not on file  . Alcohol use No     Comment: yes outside pregnancy  . Drug use: No  . Sexual activity: Yes   Other Topics Concern  . Not on file   Social History Narrative   Married. Pregnant with first child.       Works as Dietitian: knitting, gardening    Objective: BP 108/76 (BP Location: Left Arm, Patient Position: Sitting, Cuff Size: Large)   Pulse 92   Temp 98.5 F (36.9 C) (Oral)   Ht 5' 9.75" (1.772 m)  Wt 244 lb 6.4 oz (110.9 kg)   SpO2 97%   BMI 35.32 kg/m  Gen: NAD, resting comfortably HEENT: Mucous membranes are moist. Oropharynx normal Neck: no thyromegaly CV: RRR no murmurs rubs or gallops Lungs: CTAB no crackles, wheeze, rhonchi Abdomen: soft/nontender/nondistended/normal bowel sounds. No rebound or guarding. obese Ext: no edema Skin: warm, dry Neuro: grossly normal, moves all extremities, PERRLA  Assessment/Plan:  31 y.o. female presenting for annual physical.  Health Maintenance counseling: 1. Anticipatory guidance: Patient counseled regarding regular dental exams -q6 months, eye exams - every 2-3 years sees optho, wearing seatbelts.  2. Risk factor reduction:  Advised patient of need for regular exercise  and diet rich and fruits and vegetables to reduce risk of heart attack and stroke. Exercise- none at present. Strongly encouraged starting even pushing stroller 5 days a week for 30 minutes goal. Diet-does well in the day but in evening overeats - fair amount of junk food (pimento cheese, popcorn, leftovers) and eats late into night. We set goal to stop eating at 8 pm. About a 12 pack of beer a week- advised to cut that in half at least.   Wt Readings from Last 3 Encounters:  11/26/16 244 lb 6.4 oz (110.9 kg)  05/27/16 243 lb 9.6 oz (110.5 kg)  11/30/15 220 lb (99.8 kg)  3. Immunizations/screenings/ancillary studies Immunization History  Administered Date(s) Administered  . Influenza,inj,Quad PF,36+ Mos 05/25/2015, 05/27/2016  . Td 08/07/2008  4. Cervical cancer screening- 08/30/14 with [facility]. Repeat 3 years planned 5. Breast cancer screening-  breast exam with GYN and mammogram not yet indicated.  6. Colon cancer screening - no family history, start at age 31 7. Skin cancer screening- advised regular sunscreen use  Status of chronic or acute concerns   Hyperglycemia- history of gestational diabetes- need to udpate a1c Lab Results  Component Value Date   HGBA1C 5.7 11/05/2010   Depression- well controlled on zoloft 100mg . NO SI. PHQ2 of 0.   Long discussion about importance of weight loss  Attention deficit disorder (ADD) in adult ADD- well controlled on vyvanse 50mg . [facility]  Has controlled substance controact 12/26/16  Tobacco abuse Tobacco abuse- still 1 PPD- we discussed today if she is still smoking at age 31 then would discontinue ADD treatments- we need to lower her cardiac risk if going to continue ADD meds into later adulthood  6 months  Orders Placed This Encounter  Procedures  . Hemoglobin A1c    [place]  . Drug Abuse Panel 10-50, U  . CBC    [place]  . Comprehensive metabolic panel    [place]    Order Specific Question:    Has the patient fasted?    Answer:   No  . Lipid panel    [place]    Order Specific Question:   Has the patient fasted?    Answer:   No    Meds ordered this encounter  Medications  . lisdexamfetamine (VYVANSE) 50 MG capsule    Sig: Take 1 capsule (50 mg total) by mouth daily. May fill in 60 days    Dispense:  30 capsule    Refill:  0  . lisdexamfetamine (VYVANSE) 50 MG capsule    Sig: Take 1 capsule (50 mg total) by mouth daily. May fill in 30 days    Dispense:  30 capsule    Refill:  0  . lisdexamfetamine (VYVANSE) 50 MG capsule    Sig: Take 1 capsule (  50 mg total) by mouth daily.    Dispense:  30 capsule    Refill:  0    May fill today   Return precautions advised.  Tana Conch, MD

## 2016-11-26 NOTE — Patient Instructions (Addendum)
Sign release of information at the check out desk for records for ADD diagnosis from psychiatry  Strongly encourage you to quit smoking.   Wt Readings from Last 3 Encounters:  11/26/16 244 lb 6.4 oz (110.9 kg)  05/27/16 243 lb 9.6 oz (110.5 kg)  11/30/15 220 lb (99.8 kg)  Reduce beer by 50% Push stroller 30 minutes 5 days a week Stop eating after 8 pm. Cut down on snacking- replace with healthy options like vegetables  See me in 6 months

## 2016-11-26 NOTE — Assessment & Plan Note (Signed)
ADD- well controlled on vyvanse 50mg . [facility]  Has controlled substance controact 12/26/16

## 2016-12-01 ENCOUNTER — Other Ambulatory Visit: Payer: Self-pay | Admitting: Family Medicine

## 2016-12-01 LAB — DRUG ABUSE PANEL 10-50, U
AMPHETAMINE: POSITIVE — AB
AMPHETAMINES (1000 ng/mL SCRN): POSITIVE — AB
BARBITURATES: NEGATIVE
BENZODIAZEPINES: NEGATIVE
COCAINE METABOLITES: NEGATIVE
MARIJUANA MET (50 ng/mL SCRN): POSITIVE — AB
METHADONE: NEGATIVE
METHAQUALONE: NEGATIVE
OPIATES: NEGATIVE
PHENCYCLIDINE: NEGATIVE
PROPOXYPHENE: NEGATIVE

## 2017-01-15 IMAGING — US US RENAL
1 series · 14 of 25 positions shown · non-contrast
Comparison: None.

CLINICAL DATA: Left flank pain.  Thirty-five weeks pregnant.

EXAM:
RENAL / URINARY TRACT ULTRASOUND COMPLETE

[Series 1: us renal · 14 of 31 slices shown]
[im 1/31]
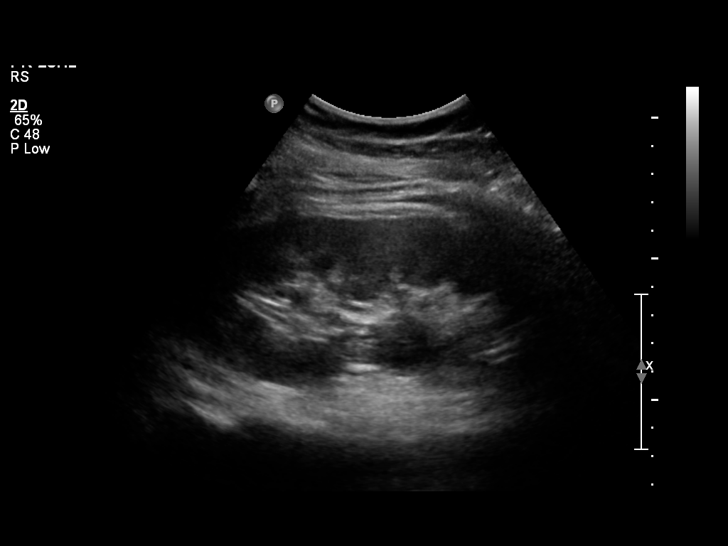
[im 3/31]
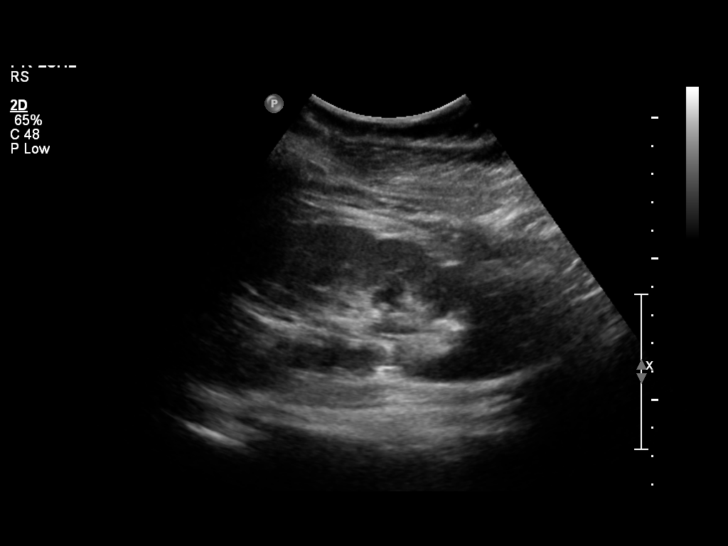
[im 6/31]
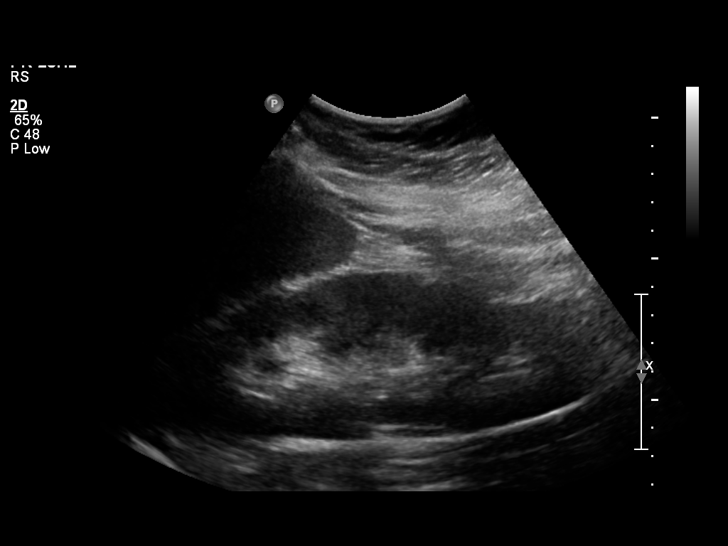
[im 8/31]
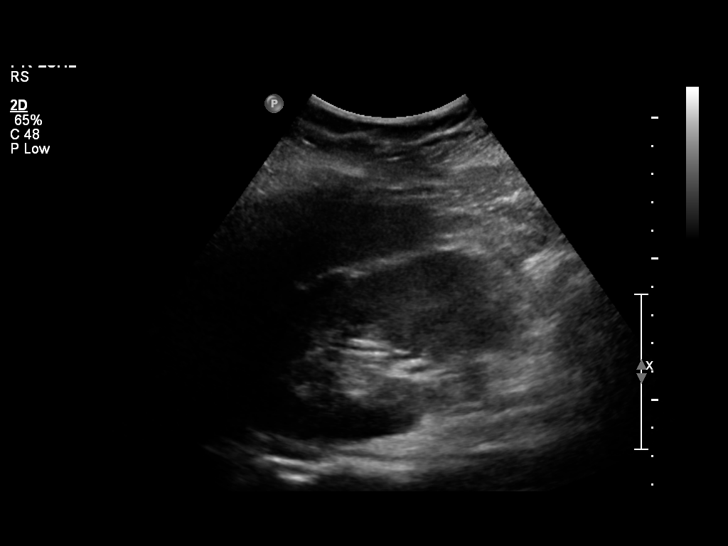
[im 11/31]
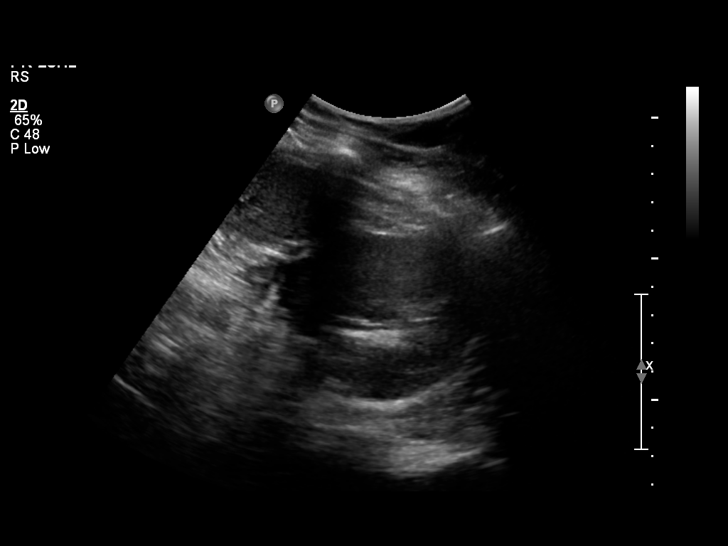
[im 12/31]
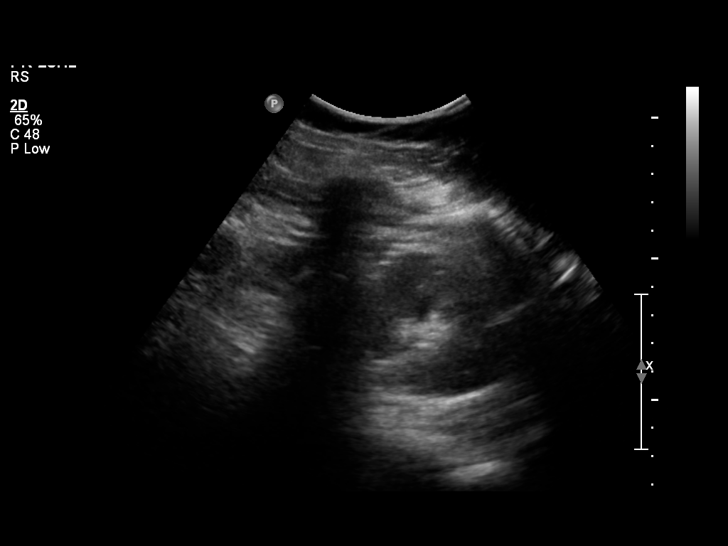
[im 14/31]
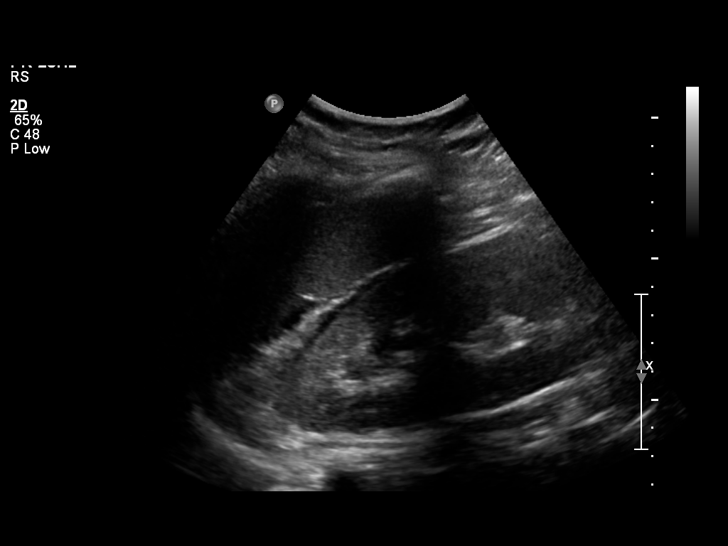
[im 17/31]
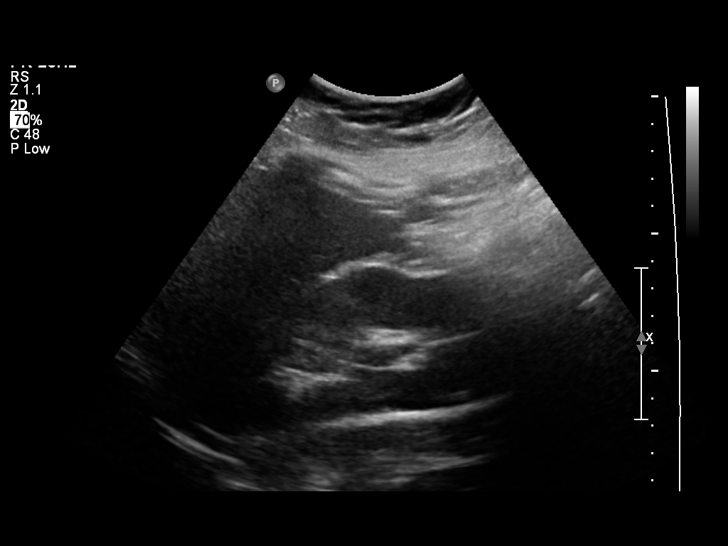
[im 19/31]
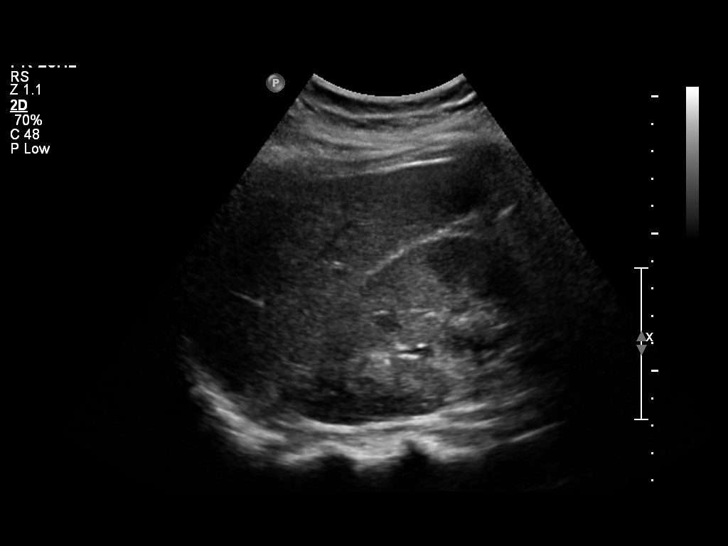
[im 21/31]
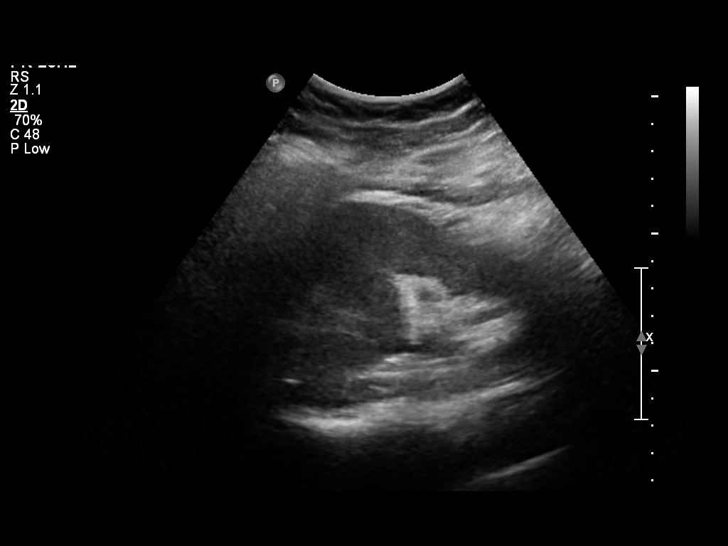
[im 23/31]
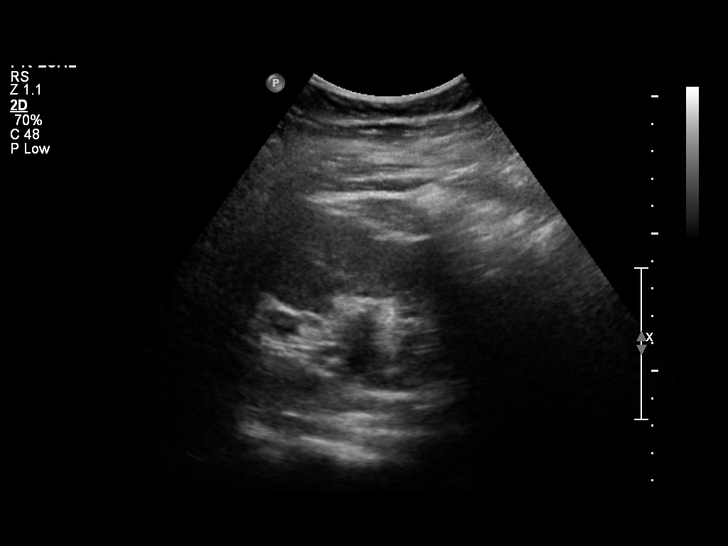
[im 26/31]
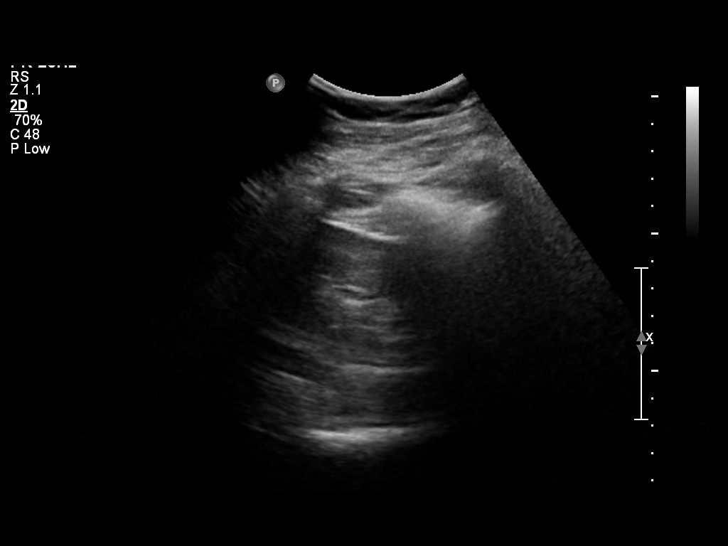
[im 28/31]
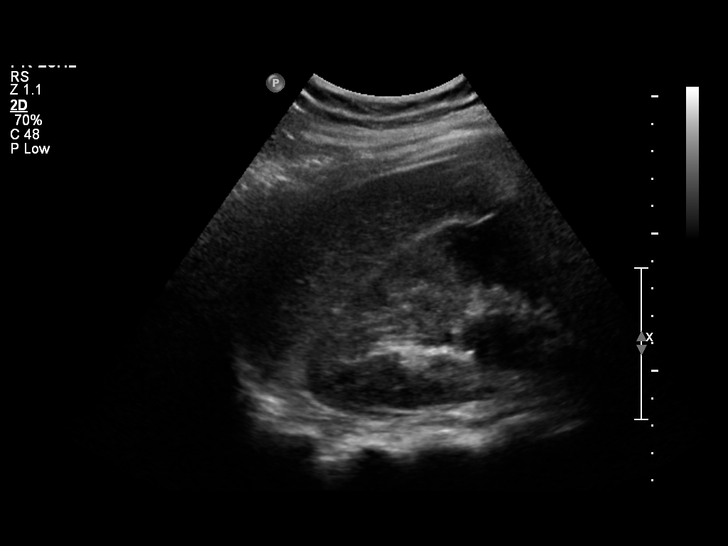
[im 31/31]
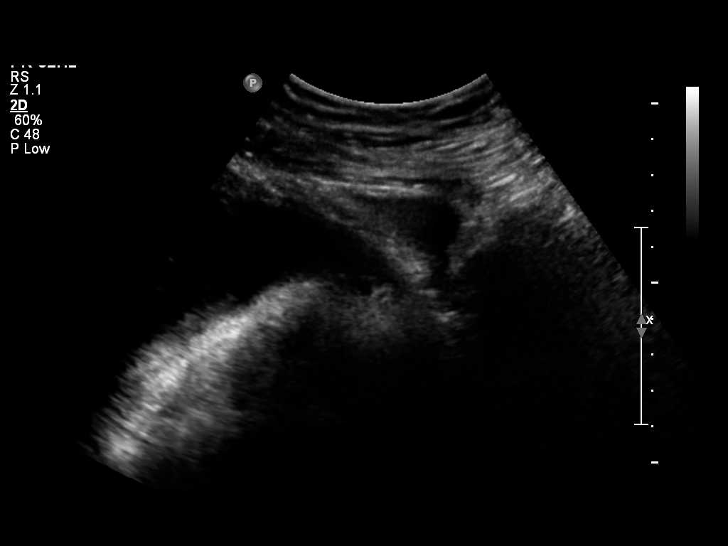

[14 of 25 positions shown; findings below may reference images not displayed]

FINDINGS: Right Kidney:

Length: 14.1 cm, within normal limits. Echogenicity within normal
limits. No mass or hydronephrosis visualized.

Left Kidney:

Length: 14.0 cm, within normal limits. Echogenicity within normal
limits. No mass or hydronephrosis visualized.

Bladder:

Appears normal for degree of bladder distention.
IMPRESSION: Negative bilateral renal ultrasound.

## 2017-01-16 IMAGING — US US ABDOMEN LIMITED
1 series · 14 of 25 positions shown · non-contrast
Comparison: None.

CLINICAL DATA: Abdominal pain, vomiting

EXAM:
US ABDOMEN LIMITED - RIGHT UPPER QUADRANT

[Series 1: us abdomen complete · 14 of 31 slices shown]
[im 1/31]
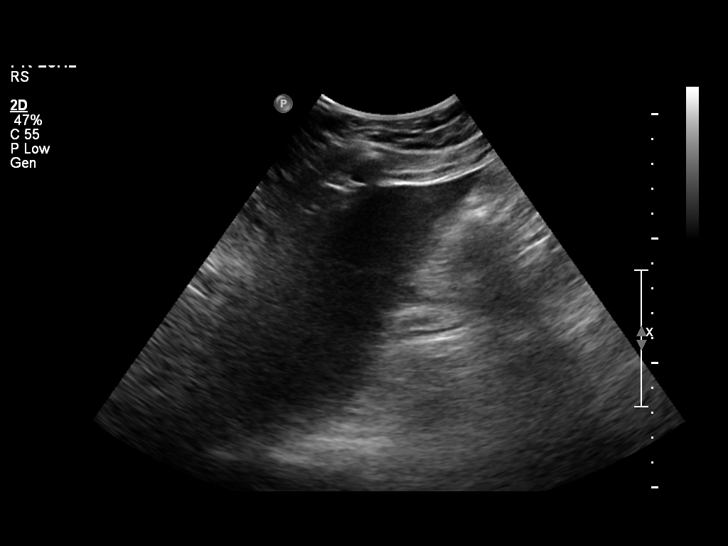
[im 3/31]
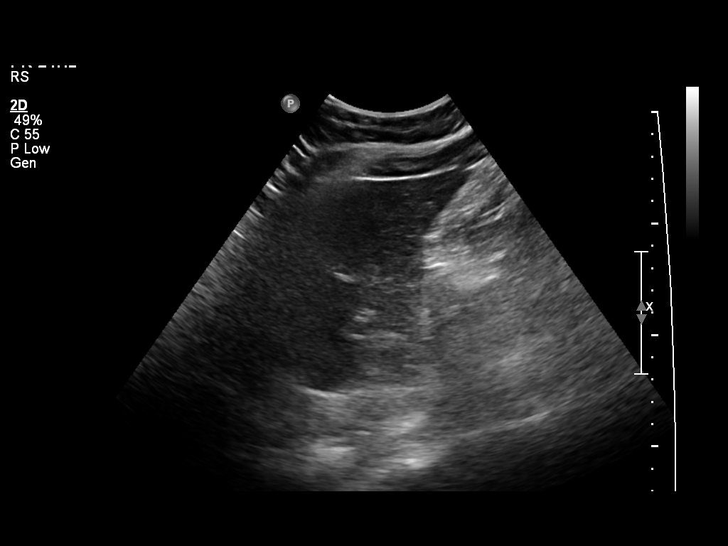
[im 6/31]
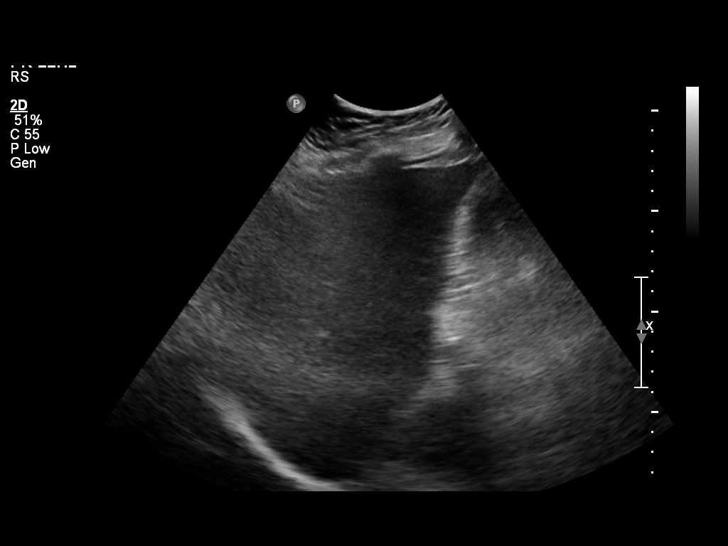
[im 8/31]
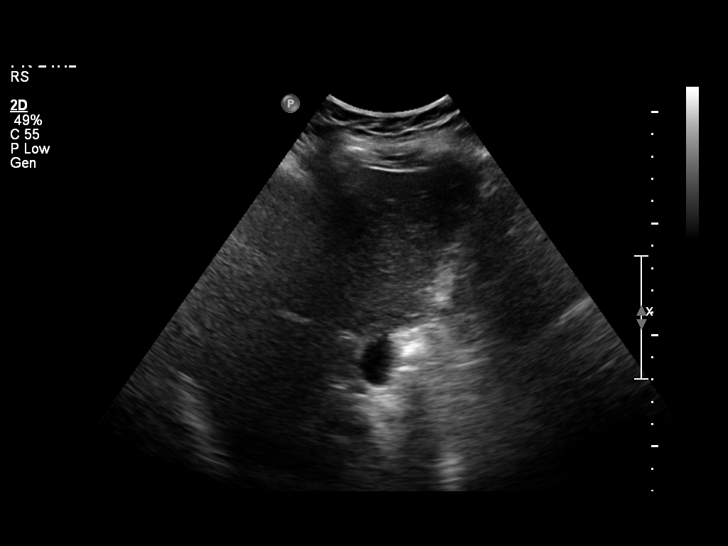
[im 11/31]
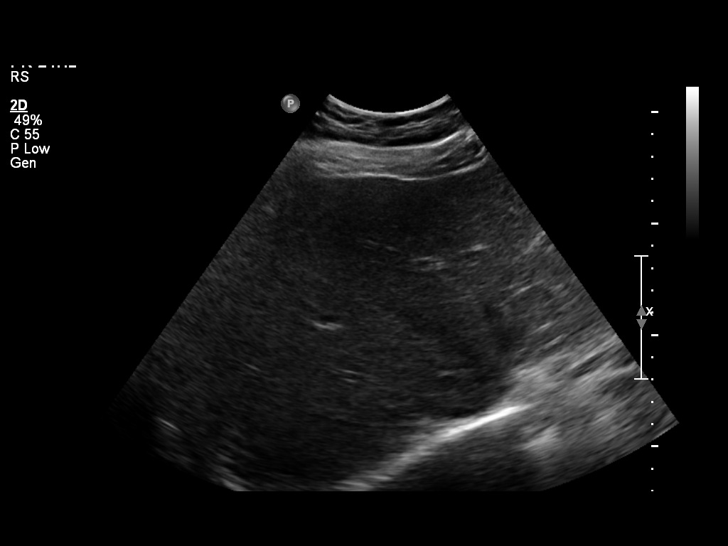
[im 12/31]
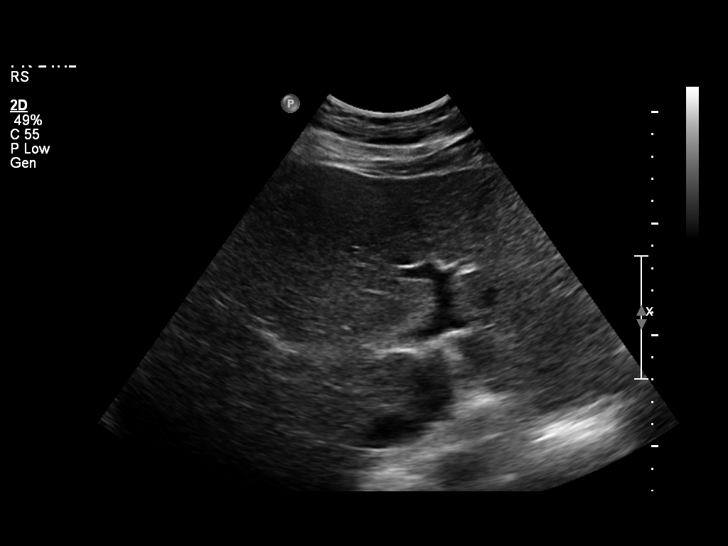
[im 14/31]
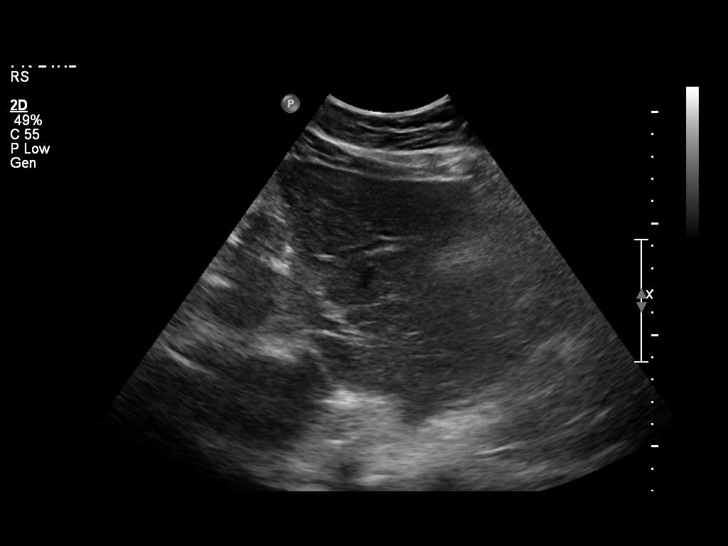
[im 17/31]
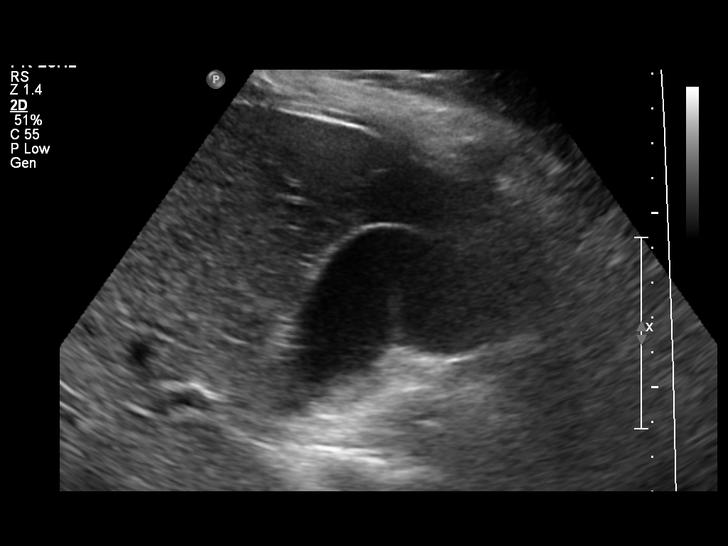
[im 19/31]
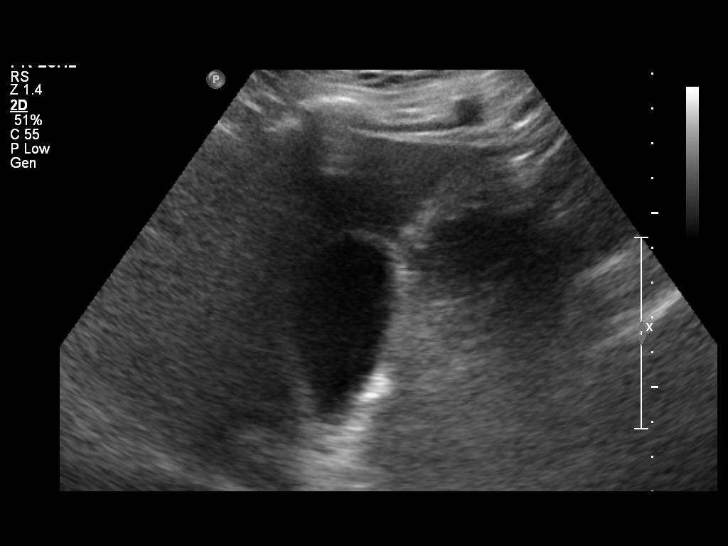
[im 21/31]
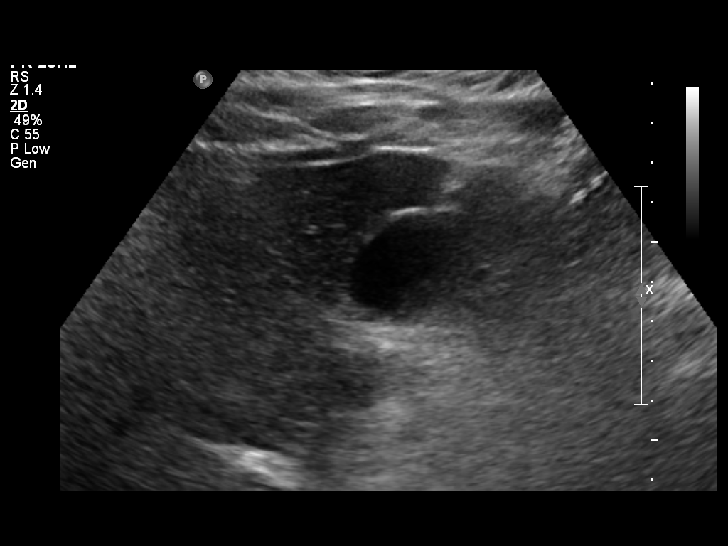
[im 23/31]
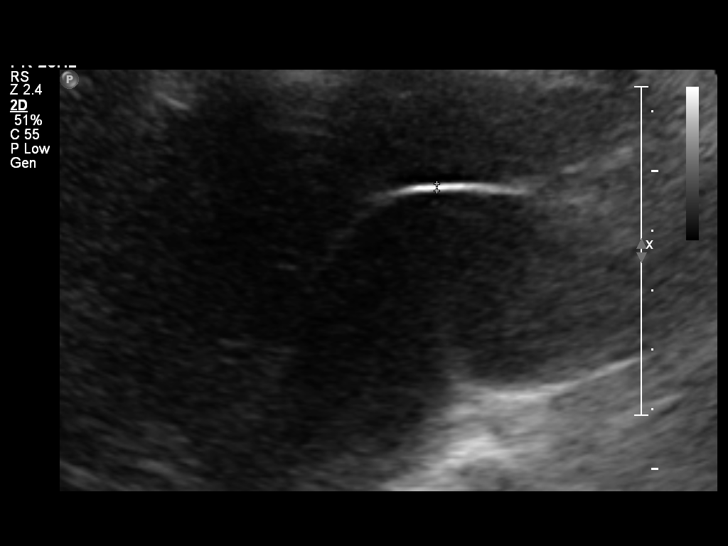
[im 26/31]
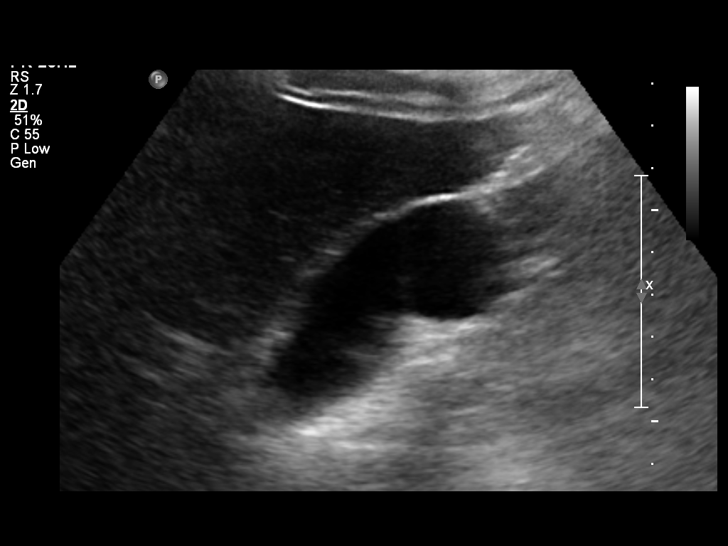
[im 28/31]
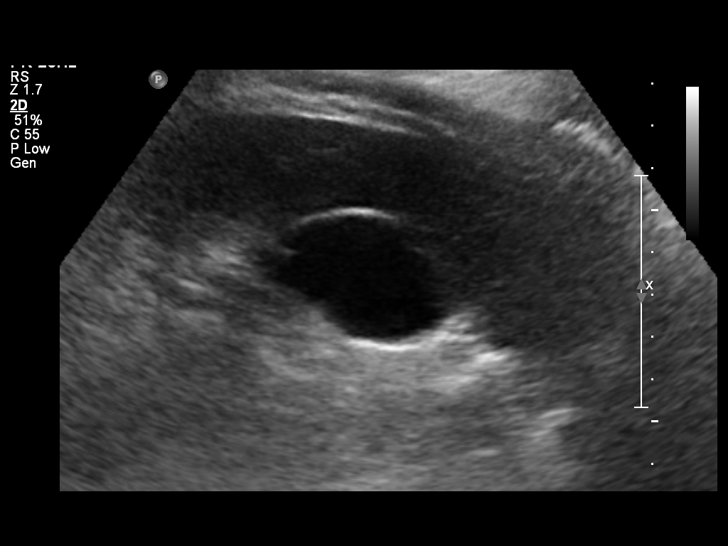
[im 31/31]
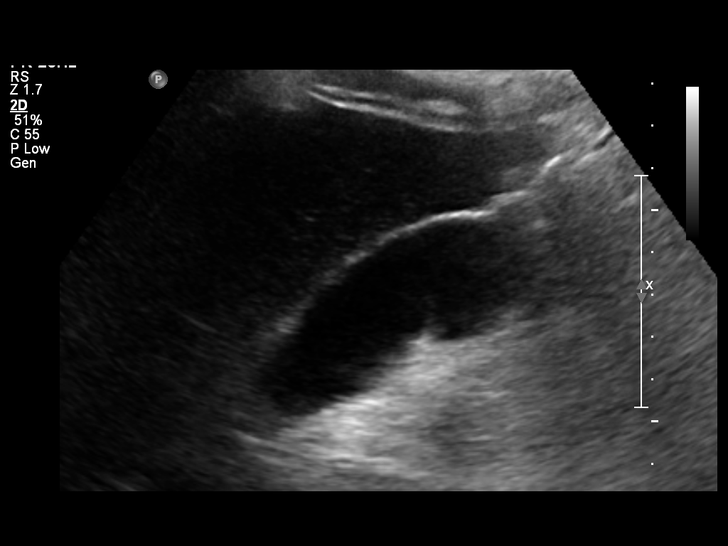

[14 of 25 positions shown; findings below may reference images not displayed]

FINDINGS: Gallbladder:

No gallstones or wall thickening visualized. No sonographic Murphy
sign noted.

Common bile duct:

Diameter: 1.9 mm

Liver:

No focal lesion identified. Within normal limits in parenchymal
echogenicity.

Other:  Pain during scanning over the mid epigastric region.
IMPRESSION: 1. No cholelithiasis or sonographic evidence of acute cholecystitis.
2. Pain during scanning over the mid epigastric region.

## 2017-02-23 ENCOUNTER — Other Ambulatory Visit: Payer: Self-pay | Admitting: Family Medicine

## 2017-02-25 ENCOUNTER — Telehealth (HOSPITAL_COMMUNITY): Payer: Self-pay

## 2017-05-22 ENCOUNTER — Ambulatory Visit: Payer: No Typology Code available for payment source | Admitting: Family Medicine

## 2017-06-23 LAB — OB RESULTS CONSOLE ANTIBODY SCREEN: ANTIBODY SCREEN: NEGATIVE

## 2017-06-23 LAB — OB RESULTS CONSOLE RUBELLA ANTIBODY, IGM: RUBELLA: IMMUNE

## 2017-06-23 LAB — OB RESULTS CONSOLE GC/CHLAMYDIA
Chlamydia: NEGATIVE
Gonorrhea: NEGATIVE

## 2017-06-23 LAB — OB RESULTS CONSOLE HEPATITIS B SURFACE ANTIGEN: HEP B S AG: NEGATIVE

## 2017-06-23 LAB — OB RESULTS CONSOLE HIV ANTIBODY (ROUTINE TESTING): HIV: NONREACTIVE

## 2017-06-23 LAB — OB RESULTS CONSOLE ABO/RH: ABO/RH(D): NEGATIVE

## 2017-06-23 LAB — OB RESULTS CONSOLE RPR: RPR: NONREACTIVE

## 2017-07-07 NOTE — L&D Delivery Note (Signed)
Delivery Note At 3:31 PM a viable female was delivered via Vaginal, Spontaneous (Presentation: LOA  ).  APGAR: 8, 9; weight 9 lb 2.7 oz (4160 g).   Placenta status: n/a.  Cord:  with the following complications: none .  Cord pH: n/a  Anesthesia:  Epidural and local Episiotomy: None Lacerations: 3rd degree Suture Repair: 2.0 3.0 vicryl Est. Blood Loss (mL): 400  Mom to postpartum.  Baby to Couplet care / Skin to Skin.  Sharon SellerJennifer M Brealynn Contino 01/27/2018, 5:06 PM

## 2017-09-16 ENCOUNTER — Other Ambulatory Visit: Payer: Self-pay

## 2017-09-16 MED ORDER — SERTRALINE HCL 100 MG PO TABS: 100.0000 mg | ORAL_TABLET | Freq: Every day | ORAL | 0 refills | Status: DC

## 2017-11-10 ENCOUNTER — Other Ambulatory Visit: Payer: Self-pay | Admitting: Family Medicine

## 2018-01-19 ENCOUNTER — Encounter (HOSPITAL_COMMUNITY): Payer: Self-pay | Admitting: *Deleted

## 2018-01-19 ENCOUNTER — Telehealth (HOSPITAL_COMMUNITY): Payer: Self-pay | Admitting: *Deleted

## 2018-01-19 LAB — OB RESULTS CONSOLE GBS: GBS: POSITIVE

## 2018-01-19 NOTE — Telephone Encounter (Signed)
Preadmission screen  

## 2018-01-20 ENCOUNTER — Encounter (HOSPITAL_COMMUNITY): Payer: Self-pay | Admitting: *Deleted

## 2018-01-26 NOTE — H&P (Signed)
HPI: 32 y/o G2P1001 @ 7651w2d estimated gestational age (as dated by LMP c/w 20 week ultrasound) presents for elective IOl.   no Leaking of Fluid,   no Vaginal Bleeding,   Some irregular Uterine Contractions,  + Fetal Movement.  Prenatal care has been provided by Dr. Charlotta Newtonzan  ROS: no HA, no epigastric pain, no visual changes.    Pregnancy complicated by: 1) A neg, s/p RhoGAM @ 28wks 2) Suspected LGA- last US (7/16, 38wk)- vertex/post/EFW 8#11oz, 3949g (>90%) 3) Depression/anxiety- on zoloft 100mg  daily 4) Obesity- BMI 35 -h/o GDM- normal 3hr this pregnancy 5) Anemia- on iron daily   Prenatal Transfer Tool  Maternal Diabetes: No Genetic Screening: Normal Maternal Ultrasounds/Referrals: Normal Fetal Ultrasounds or other Referrals:  None Maternal Substance Abuse:  No Significant Maternal Medications:  Meds include: Zoloft Significant Maternal Lab Results: Lab values include: Group B Strep positive, Rh negative   PNL:  GBS positive, Rub Immune, Hep B neg, RPR NR, HIV neg, GC/C neg, glucola:146, 3hr test within normal limits Hgb 10.9 Blood type: A negative  Immunizations: Tdap: 12/02/17 Flu: 08/14/17  OBHx: @ 37wk- FTNSVD- 7#5oz, female, GDMA2 PMHx:  Depression/anxiety, ADHD Meds:  PNV, zoloft, iron, previously on vyvanse Allergy:   Allergies  Allergen Reactions  . Amoxicillin-Pot Clavulanate Hives and Rash  . Ceclor [Cefaclor] Hives and Rash  . Eryzole Hives and Rash    Pediazole   SurgHx: none SocHx:   no Tobacco, no  EtOH, no Illicit Drugs  O: LMP 04/27/2017  to be obtained, examination in office Gen. AAOx3, NAD CV.  RRR  No murmur.  Resp. CTAB, no wheeze or crackles. Abd. Gravid,  no tenderness,  no rigidity,  no guarding Extr.  no edema B/L , no calf tenderness, neg Homan's B/L FHT: 140 by doppler SVE: 1-2/25/-3, vertex on exam   Labs: see orders  A/P:  32 y.o. G2P1001 @ 3151w2d EGA who presents for elective IOL, suspected LGA -FWB:  Reassuring by doppler -Labor: plan  for cytotec -GBS: positive, with PCN allergy, Clindamycin per protocol -IV or epidural upon request -Dep/anxiety: continue zoloft 100mg  daily  Myna HidalgoJennifer Thayden Lemire, DO [phone] (cell) [phone] (office)

## 2018-01-27 ENCOUNTER — Encounter (HOSPITAL_COMMUNITY): Payer: Self-pay

## 2018-01-27 ENCOUNTER — Inpatient Hospital Stay (HOSPITAL_COMMUNITY): Payer: No Typology Code available for payment source | Admitting: Anesthesiology

## 2018-01-27 ENCOUNTER — Inpatient Hospital Stay (HOSPITAL_COMMUNITY)
Admission: RE | Admit: 2018-01-27 | Discharge: 2018-01-28 | DRG: 768 | Disposition: A | Discharge status: Home / Self Care | Payer: No Typology Code available for payment source | Attending: Obstetrics & Gynecology | Admitting: Obstetrics & Gynecology

## 2018-01-27 DIAGNOSIS — D649 Anemia, unspecified: Secondary | ICD-10-CM | POA: Diagnosis present

## 2018-01-27 DIAGNOSIS — F329 Major depressive disorder, single episode, unspecified: Secondary | ICD-10-CM | POA: Diagnosis present

## 2018-01-27 DIAGNOSIS — O26893 Other specified pregnancy related conditions, third trimester: Secondary | ICD-10-CM | POA: Diagnosis present

## 2018-01-27 DIAGNOSIS — O9902 Anemia complicating childbirth: Secondary | ICD-10-CM | POA: Diagnosis present

## 2018-01-27 DIAGNOSIS — E669 Obesity, unspecified: Secondary | ICD-10-CM | POA: Diagnosis present

## 2018-01-27 DIAGNOSIS — O99344 Other mental disorders complicating childbirth: Secondary | ICD-10-CM | POA: Diagnosis present

## 2018-01-27 DIAGNOSIS — Z880 Allergy status to penicillin: Secondary | ICD-10-CM | POA: Diagnosis not present

## 2018-01-27 DIAGNOSIS — O3663X0 Maternal care for excessive fetal growth, third trimester, not applicable or unspecified: Principal | ICD-10-CM | POA: Diagnosis present

## 2018-01-27 DIAGNOSIS — O99214 Obesity complicating childbirth: Secondary | ICD-10-CM | POA: Diagnosis present

## 2018-01-27 DIAGNOSIS — O99824 Streptococcus B carrier state complicating childbirth: Secondary | ICD-10-CM | POA: Diagnosis present

## 2018-01-27 DIAGNOSIS — Z6791 Unspecified blood type, Rh negative: Secondary | ICD-10-CM | POA: Diagnosis not present

## 2018-01-27 DIAGNOSIS — Z3A39 39 weeks gestation of pregnancy: Secondary | ICD-10-CM

## 2018-01-27 DIAGNOSIS — Z3483 Encounter for supervision of other normal pregnancy, third trimester: Secondary | ICD-10-CM | POA: Diagnosis present

## 2018-01-27 DIAGNOSIS — F419 Anxiety disorder, unspecified: Secondary | ICD-10-CM | POA: Diagnosis present

## 2018-01-27 LAB — CBC
HEMATOCRIT: 32.4 % — AB (ref 36.0–46.0)
Hemoglobin: 11 g/dL — ABNORMAL LOW (ref 12.0–15.0)
MCH: 30.4 pg (ref 26.0–34.0)
MCHC: 34 g/dL (ref 30.0–36.0)
MCV: 89.5 fL (ref 78.0–100.0)
Platelets: 309 10*3/uL (ref 150–400)
RBC: 3.62 MIL/uL — ABNORMAL LOW (ref 3.87–5.11)
RDW: 13.9 % (ref 11.5–15.5)
WBC: 10.2 10*3/uL (ref 4.0–10.5)

## 2018-01-27 LAB — RPR: RPR Ser Ql: NONREACTIVE

## 2018-01-27 LAB — TYPE AND SCREEN
ABO/RH(D): A NEG
Antibody Screen: NEGATIVE

## 2018-01-27 MED ORDER — LACTATED RINGERS IV SOLN: 500.0000 mL | INTRAVENOUS | Status: DC | PRN

## 2018-01-27 MED ORDER — SOD CITRATE-CITRIC ACID 500-334 MG/5ML PO SOLN: 30.0000 mL | ORAL | Status: DC | PRN

## 2018-01-27 MED ORDER — OXYCODONE HCL 5 MG PO TABS: 10.0000 mg | ORAL_TABLET | ORAL | Status: DC | PRN

## 2018-01-27 MED ORDER — COCONUT OIL OIL: 1.0000 "application " | TOPICAL_OIL | Status: DC | PRN

## 2018-01-27 MED ORDER — PHENYLEPHRINE 40 MCG/ML (10ML) SYRINGE FOR IV PUSH (FOR BLOOD PRESSURE SUPPORT)
80.0000 ug | PREFILLED_SYRINGE | INTRAVENOUS | Status: DC | PRN
  Filled 2018-01-27: qty 5

## 2018-01-27 MED ORDER — DIBUCAINE 1 % RE OINT: 1.0000 "application " | TOPICAL_OINTMENT | RECTAL | Status: DC | PRN

## 2018-01-27 MED ORDER — LACTATED RINGERS IV SOLN
INTRAVENOUS | Status: DC
  Administered 2018-01-27 (×3): via INTRAVENOUS

## 2018-01-27 MED ORDER — ONDANSETRON HCL 4 MG/2ML IJ SOLN
4.0000 mg | Freq: Four times a day (QID) | INTRAMUSCULAR | Status: DC | PRN
  Administered 2018-01-27: 4 mg via INTRAVENOUS
  Filled 2018-01-27: qty 2

## 2018-01-27 MED ORDER — FENTANYL 2.5 MCG/ML BUPIVACAINE 1/10 % EPIDURAL INFUSION (WH - ANES)
14.0000 mL/h | INTRAMUSCULAR | Status: DC | PRN
  Administered 2018-01-27: 14 mL/h via EPIDURAL
  Filled 2018-01-27: qty 100

## 2018-01-27 MED ORDER — ONDANSETRON HCL 4 MG/2ML IJ SOLN: 4.0000 mg | INTRAMUSCULAR | Status: DC | PRN

## 2018-01-27 MED ORDER — EPHEDRINE 5 MG/ML INJ
10.0000 mg | INTRAVENOUS | Status: DC | PRN
  Filled 2018-01-27: qty 2

## 2018-01-27 MED ORDER — PRENATAL MULTIVITAMIN CH
1.0000 | ORAL_TABLET | Freq: Every day | ORAL | Status: DC
  Administered 2018-01-28: 1 via ORAL
  Filled 2018-01-27: qty 1

## 2018-01-27 MED ORDER — TERBUTALINE SULFATE 1 MG/ML IJ SOLN
0.2500 mg | Freq: Once | INTRAMUSCULAR | Status: DC | PRN
  Filled 2018-01-27: qty 1

## 2018-01-27 MED ORDER — WITCH HAZEL-GLYCERIN EX PADS: 1.0000 "application " | MEDICATED_PAD | CUTANEOUS | Status: DC | PRN

## 2018-01-27 MED ORDER — OXYTOCIN 40 UNITS IN LACTATED RINGERS INFUSION - SIMPLE MED
1.0000 m[IU]/min | INTRAVENOUS | Status: DC
  Administered 2018-01-27: 2 m[IU]/min via INTRAVENOUS
  Filled 2018-01-27: qty 1000

## 2018-01-27 MED ORDER — LACTATED RINGERS IV SOLN: 500.0000 mL | Freq: Once | INTRAVENOUS | Status: DC

## 2018-01-27 MED ORDER — PHENYLEPHRINE 40 MCG/ML (10ML) SYRINGE FOR IV PUSH (FOR BLOOD PRESSURE SUPPORT)
80.0000 ug | PREFILLED_SYRINGE | INTRAVENOUS | Status: DC | PRN
  Filled 2018-01-27: qty 10
  Filled 2018-01-27: qty 5

## 2018-01-27 MED ORDER — SENNOSIDES-DOCUSATE SODIUM 8.6-50 MG PO TABS
1.0000 | ORAL_TABLET | Freq: Two times a day (BID) | ORAL | Status: DC
  Administered 2018-01-27 – 2018-01-28 (×2): 1 via ORAL
  Filled 2018-01-27 (×2): qty 1

## 2018-01-27 MED ORDER — MISOPROSTOL 25 MCG QUARTER TABLET
25.0000 ug | ORAL_TABLET | ORAL | Status: DC | PRN
  Administered 2018-01-27 (×2): 25 ug via VAGINAL
  Filled 2018-01-27 (×3): qty 1

## 2018-01-27 MED ORDER — OXYCODONE HCL 5 MG PO TABS: 5.0000 mg | ORAL_TABLET | ORAL | Status: DC | PRN

## 2018-01-27 MED ORDER — OXYTOCIN 40 UNITS IN LACTATED RINGERS INFUSION - SIMPLE MED: 2.5000 [IU]/h | INTRAVENOUS | Status: DC

## 2018-01-27 MED ORDER — BUTORPHANOL TARTRATE 1 MG/ML IJ SOLN
1.0000 mg | INTRAMUSCULAR | Status: DC
  Filled 2018-01-27: qty 1

## 2018-01-27 MED ORDER — DIPHENHYDRAMINE HCL 50 MG/ML IJ SOLN: 12.5000 mg | INTRAMUSCULAR | Status: DC | PRN

## 2018-01-27 MED ORDER — BENZOCAINE-MENTHOL 20-0.5 % EX AERO
1.0000 "application " | INHALATION_SPRAY | CUTANEOUS | Status: DC | PRN
  Administered 2018-01-27: 1 via TOPICAL
  Filled 2018-01-27: qty 56

## 2018-01-27 MED ORDER — LIDOCAINE HCL (PF) 1 % IJ SOLN
30.0000 mL | INTRAMUSCULAR | Status: DC | PRN
  Administered 2018-01-27: 30 mL via SUBCUTANEOUS
  Filled 2018-01-27: qty 30

## 2018-01-27 MED ORDER — OXYTOCIN BOLUS FROM INFUSION
500.0000 mL | Freq: Once | INTRAVENOUS | Status: AC
  Administered 2018-01-27: 500 mL via INTRAVENOUS

## 2018-01-27 MED ORDER — BUTORPHANOL TARTRATE 1 MG/ML IJ SOLN
1.0000 mg | INTRAMUSCULAR | Status: DC | PRN
  Administered 2018-01-27: 1 mg via INTRAVENOUS

## 2018-01-27 MED ORDER — SIMETHICONE 80 MG PO CHEW: 80.0000 mg | CHEWABLE_TABLET | ORAL | Status: DC | PRN

## 2018-01-27 MED ORDER — ACETAMINOPHEN 325 MG PO TABS: 650.0000 mg | ORAL_TABLET | ORAL | Status: DC | PRN

## 2018-01-27 MED ORDER — CLINDAMYCIN PHOSPHATE 900 MG/50ML IV SOLN
900.0000 mg | Freq: Three times a day (TID) | INTRAVENOUS | Status: DC
  Administered 2018-01-27 (×2): 900 mg via INTRAVENOUS
  Filled 2018-01-27 (×4): qty 50

## 2018-01-27 MED ORDER — ACETAMINOPHEN 325 MG PO TABS
650.0000 mg | ORAL_TABLET | ORAL | Status: DC | PRN
  Administered 2018-01-28: 650 mg via ORAL
  Filled 2018-01-27: qty 2

## 2018-01-27 MED ORDER — SERTRALINE HCL 100 MG PO TABS
100.0000 mg | ORAL_TABLET | Freq: Every day | ORAL | Status: DC
  Administered 2018-01-28: 100 mg via ORAL
  Filled 2018-01-27 (×2): qty 1

## 2018-01-27 MED ORDER — CLINDAMYCIN PHOSPHATE 900 MG/50ML IV SOLN
900.0000 mg | Freq: Three times a day (TID) | INTRAVENOUS | Status: DC
  Administered 2018-01-27: 900 mg via INTRAVENOUS
  Filled 2018-01-27 (×2): qty 50

## 2018-01-27 MED ORDER — OXYCODONE-ACETAMINOPHEN 5-325 MG PO TABS: 2.0000 | ORAL_TABLET | ORAL | Status: DC | PRN

## 2018-01-27 MED ORDER — ZOLPIDEM TARTRATE 5 MG PO TABS: 5.0000 mg | ORAL_TABLET | Freq: Every evening | ORAL | Status: DC | PRN

## 2018-01-27 MED ORDER — ONDANSETRON HCL 4 MG PO TABS: 4.0000 mg | ORAL_TABLET | ORAL | Status: DC | PRN

## 2018-01-27 MED ORDER — SERTRALINE HCL 100 MG PO TABS
100.0000 mg | ORAL_TABLET | Freq: Every day | ORAL | Status: DC
  Administered 2018-01-27: 100 mg via ORAL
  Filled 2018-01-27 (×2): qty 1

## 2018-01-27 MED ORDER — OXYCODONE-ACETAMINOPHEN 5-325 MG PO TABS: 1.0000 | ORAL_TABLET | ORAL | Status: DC | PRN

## 2018-01-27 MED ORDER — DIPHENHYDRAMINE HCL 25 MG PO CAPS: 25.0000 mg | ORAL_CAPSULE | Freq: Four times a day (QID) | ORAL | Status: DC | PRN

## 2018-01-27 MED ORDER — LIDOCAINE HCL (PF) 1 % IJ SOLN
INTRAMUSCULAR | Status: DC | PRN
  Administered 2018-01-27 (×2): 5 mL via EPIDURAL

## 2018-01-27 MED ORDER — IBUPROFEN 600 MG PO TABS
600.0000 mg | ORAL_TABLET | Freq: Four times a day (QID) | ORAL | Status: DC
  Administered 2018-01-27 – 2018-01-28 (×4): 600 mg via ORAL
  Filled 2018-01-27 (×4): qty 1

## 2018-01-27 NOTE — Progress Notes (Signed)
OB PN:  S: Pt resting comfortably, no acute complaints  O: BP 132/80   Pulse 91   Temp (!) 97.4 F (36.3 C) (Oral)   Resp 17   Ht 5\' 10"  (1.778 m)   Wt 113.4 kg (250 lb)   LMP 04/27/2017   SpO2 100%   BMI 35.87 kg/m   FHT: 135bpm, moderate variablity, + accels, no decels Toco: q1-134min SVE: 5/80/-2, AROM light meconium  A/P: 32 y.o. G2P1001 @ 6770w2d for IOL 1. FWB: Cat. I 2. Labor: continue Pit per protocol Pain: continue with epidural GBS: continue IV Clindamycin  Myna HidalgoJennifer Avi Kerschner, DO [phone] (cell) [phone] (office)

## 2018-01-27 NOTE — Anesthesia Preprocedure Evaluation (Signed)
Anesthesia Evaluation  Patient identified by MRN, date of birth, ID band Patient awake    Reviewed: Allergy & Precautions, H&P , NPO status , Patient's Chart, lab work & pertinent test results  Airway Mallampati: II   Neck ROM: full    Dental   Pulmonary Current Smoker,    breath sounds clear to auscultation       Cardiovascular negative cardio ROS   Rhythm:regular Rate:Normal     Neuro/Psych PSYCHIATRIC DISORDERS Anxiety Depression    GI/Hepatic   Endo/Other    Renal/GU      Musculoskeletal   Abdominal   Peds  Hematology  (+) anemia ,   Anesthesia Other Findings   Reproductive/Obstetrics (+) Pregnancy                             Anesthesia Physical Anesthesia Plan  ASA: II  Anesthesia Plan: Epidural   Post-op Pain Management:    Induction: Intravenous  PONV Risk Score and Plan: 1 and Treatment may vary due to age or medical condition  Airway Management Planned: Natural Airway  Additional Equipment:   Intra-op Plan:   Post-operative Plan:   Informed Consent: I have reviewed the patients History and Physical, chart, labs and discussed the procedure including the risks, benefits and alternatives for the proposed anesthesia with the patient or authorized representative who has indicated his/her understanding and acceptance.     Plan Discussed with: Anesthesiologist and Surgeon  Anesthesia Plan Comments:         Anesthesia Quick Evaluation

## 2018-01-27 NOTE — Progress Notes (Signed)
MOB was referred for history of depression/anxiety. * Referral screened out by Clinical Social Worker because none of the following criteria appear to apply: ~ History of anxiety/depression during this pregnancy, or of post-partum depression. ~ Diagnosis of anxiety and/or depression within last 3 years (2011) OR * MOB's symptoms currently being treated with medication and/or therapy. Please contact the Clinical Social Worker if needs arise, by MOB request, or if MOB scores greater than 9/yes to question 10 on Edinburgh Postpartum Depression Screen.  MOB has Rx for Zoloft and PNR states "doing well." 

## 2018-01-27 NOTE — Progress Notes (Signed)
OB PN:  S: Pt resting comfortably, no acute complaints  O: BP (!) 99/50   Pulse 79   Temp 98.3 F (36.8 C) (Oral)   Resp 18   LMP 04/27/2017   FHT: 130bpm, moderate variablity, + accels, no decels Toco: irregular SVE: 1.5/50/-3, Cook balloon place, 50cc  A/P: 32 y.o. G2P1001 @ 3936w2d for eIOL 1. FWB: Cat. I 2. Labor: s/p cytotec x2, Cook balloon placed this am, plan to transition to Pit per protocol Pain: IV or epidural upon request GBS: positive, continue IV Clindamcyin Dep/anxiety: continue zoloft 100mg  daily  Myna HidalgoJennifer Lalisa Kiehn, DO [phone] (cell) [phone] (office)

## 2018-01-27 NOTE — Anesthesia Procedure Notes (Signed)
Epidural Patient location during procedure: OB Start time: 01/27/2018 10:16 AM End time: 01/27/2018 10:25 AM  Staffing Anesthesiologist: Achille RichHodierne, Laprecious Austill, MD Performed: anesthesiologist   Preanesthetic Checklist Completed: patient identified, site marked, pre-op evaluation, timeout performed, IV checked, risks and benefits discussed and monitors and equipment checked  Epidural Patient position: sitting Prep: DuraPrep Patient monitoring: heart rate, cardiac monitor, continuous pulse ox and blood pressure Approach: midline Location: L2-L3 Injection technique: LOR saline  Needle:  Needle type: Tuohy  Needle gauge: 17 G Needle length: 9 cm Needle insertion depth: 6 cm Catheter type: closed end flexible Catheter size: 19 Gauge Catheter at skin depth: 12 cm Test dose: negative and Other  Assessment Events: blood not aspirated, injection not painful, no injection resistance and negative IV test  Additional Notes Informed consent obtained prior to proceeding including risk of failure, 1% risk of PDPH, risk of minor discomfort and bruising.  Discussed rare but serious complications including epidural abscess, permanent nerve injury, epidural hematoma.  Discussed alternatives to epidural analgesia and patient desires to proceed.  Timeout performed pre-procedure verifying patient name, procedure, and platelet count.  Patient tolerated procedure well. Reason for block:procedure for pain

## 2018-01-27 NOTE — Anesthesia Pain Management Evaluation Note (Signed)
  CRNA Pain Management Visit Note  Patient: Sara Reid, 32 y.o., female  "Hello [facility] am [facility]. We have an anesthesia team available at all times to provide care throughout the hospital, including epidural management and anesthesia for C-section. I don't know your plan for the delivery whether it a natural birth, water birth, IV sedation, nitrous supplementation, doula or epidural, but we want to meet your pain goals."   1.Was your pain managed to your expectations on prior hospitalizations?   Yes   2.What is your expectation for pain management during this hospitalization?     Epidural  3.How can we help you reach that goal? epidural  Record the patient's initial score and the patient's pain goal.   Pain: 6  Pain Goal: 7 [facility] wants you to be able to say your pain was always managed very well.  Armida Vickroy 01/27/2018

## 2018-01-28 LAB — CBC
HCT: 29.4 % — ABNORMAL LOW (ref 36.0–46.0)
HEMOGLOBIN: 10.1 g/dL — AB (ref 12.0–15.0)
MCH: 30.7 pg (ref 26.0–34.0)
MCHC: 34.4 g/dL (ref 30.0–36.0)
MCV: 89.4 fL (ref 78.0–100.0)
Platelets: 267 10*3/uL (ref 150–400)
RBC: 3.29 MIL/uL — AB (ref 3.87–5.11)
RDW: 13.8 % (ref 11.5–15.5)
WBC: 11.7 10*3/uL — ABNORMAL HIGH (ref 4.0–10.5)

## 2018-01-28 MED ORDER — IBUPROFEN 600 MG PO TABS: 600.0000 mg | ORAL_TABLET | Freq: Four times a day (QID) | ORAL | 0 refills | Status: DC

## 2018-01-28 MED ORDER — RHO D IMMUNE GLOBULIN 1500 UNIT/2ML IJ SOSY
300.0000 ug | PREFILLED_SYRINGE | Freq: Once | INTRAMUSCULAR | Status: AC
  Administered 2018-01-28: 300 ug via INTRAMUSCULAR
  Filled 2018-01-28: qty 2

## 2018-01-28 NOTE — Progress Notes (Signed)
Postpartum Note Day # 1  S:  Patient resting comfortable in bed.  Pain controlled.  Tolerating general diet. + flatus, no BM.  Lochia moderate.  Ambulating without difficulty.  She denies n/v/f/c, SOB, or CP.  Pt plans on breastfeeding.  O: Temp:  [97.4 F (36.3 C)-98.5 F (36.9 C)] 98.2 F (36.8 C) (07/24 2300) Pulse Rate:  [69-137] 69 (07/25 0315) Resp:  [14-18] 14 (07/25 0315) BP: (109-141)/(50-90) 109/65 (07/25 0315) SpO2:  [98 %-100 %] 99 % (07/25 0315) Weight:  [113.4 kg (250 lb)] 113.4 kg (250 lb) (07/24 0734) Gen: A&Ox3, NAD CV: RRR, no MRG Resp: CTAB Abdomen: soft, NT, ND Uterus: firm, non-tender, below umbilicus Ext: No edema, no calf tenderness bilaterally, SCDs in place  Labs:  Recent Labs    01/27/18 0050 01/28/18 0522  HGB 11.0* 10.1*    A/P: Pt is a 32 y.o. [id number] s/p NSVD, PPD#1  - Pain well controlled -GU: UOP is adequate -GI: Tolerating general diet -Activity: encouraged sitting up to chair and ambulation as tolerated -Prophylaxis: early ambulation -Labs: stable as above -baby boy circ to be completed later today -Dep/anxiety: continue zoloft 100mg  daily  DISPO: Meeting postpartum milestones appropriately, will consider early discharge home if ok with PEDS  Myna HidalgoJennifer Jessel Gettinger, DO [phone] (cell) [phone] (office)

## 2018-01-28 NOTE — Lactation Note (Signed)
This note was copied from a baby's chart. Lactation Consultation Note  Patient Name: Sara Reid ZOXWR'UToday's Date: 01/28/2018 Reason for consult: Initial assessment;Term  Visited with P2 Mom of 22 hr old baby.  Mom states baby is latching well, and feeding often.  Reviewed importance of a deep areolar latch.  Latch score given a 8 Encouraged STS and cue based feeding.  Goal of 8-12 feedings per 24 hrs. Lactation brochure given to Mom.  Mom aware of OP lactation support available.   Encouraged to call prn for assistance.  Consult Status Consult Status: Follow-up Date: 01/29/18 Follow-up type: In-patient    [name], Sahian Kerney E 01/28/2018, 2:29 PM

## 2018-01-28 NOTE — Anesthesia Postprocedure Evaluation (Signed)
Anesthesia Post Note  Patient: Sara BellowElizabeth Vestal Neighbors  Procedure(s) Performed: AN AD HOC LABOR EPIDURAL     Patient location during evaluation: Mother Baby Anesthesia Type: Epidural Level of consciousness: awake and alert Pain management: pain level controlled Respiratory status: spontaneous breathing Cardiovascular status: stable Postop Assessment: no headache, adequate PO intake, no backache, patient able to bend at knees, able to ambulate, epidural receding and no apparent nausea or vomiting Anesthetic complications: no    Last Vitals:  Vitals:   01/27/18 2300 01/28/18 0315  BP: 133/78 109/65  Pulse: 79 69  Resp:  14  Temp: 36.8 C   SpO2: 99% 99%    Last Pain:  Vitals:   01/28/18 0752  TempSrc:   PainSc: 2    Pain Goal: Patients Stated Pain Goal: 0 (01/27/18 2300)               Salome ArntSterling, Kelleen Stolze Marie

## 2018-01-28 NOTE — Discharge Summary (Signed)
OB Discharge Summary     Patient Name: Sara Reid DOB: [birth year] MRN: [id number]  Date of admission: 01/27/2018 Delivering MD: Geryl Rankins   Date of discharge: 01/28/2018  Admitting diagnosis: INDUCTION Intrauterine pregnancy: [redacted]w[redacted]d     Secondary diagnosis:  Active Problems:   Labor and delivery, indication for care  Additional problems: Suspected LGA, Depression/anxiety     Discharge diagnosis: Term Pregnancy Delivered and depression/anxiety                                                                                                Post partum procedures:n/a  Augmentation: AROM, Pitocin, Cytotec and Foley Balloon  Complications: None  Hospital course:  Induction of Labor With Vaginal Delivery   32 y.o. yo G2P2002 at [redacted]w[redacted]d was admitted to the hospital 01/27/2018 for induction of labor.  Indication for induction: Favorable cervix at term.  Patient had an uncomplicated labor course as follows: Membrane Rupture Time/Date: 12:40 PM ,01/27/2018   Intrapartum Procedures: Episiotomy: None [1]                                         Lacerations:  3rd degree [4]  Patient had delivery of a Viable infant.  Information for the patient's newborn:  Sara Reid [[id number]]  Delivery Method: Vaginal, Spontaneous(Filed from Delivery Summary)   01/27/2018  Details of delivery can be found in separate delivery note.  Patient had a routine postpartum course. Patient is discharged home 01/28/18.  Physical exam  Vitals:   01/27/18 1910 01/27/18 1945 01/27/18 2300 01/28/18 0315  BP: 118/90 128/83 133/78 109/65  Pulse: 88 86 79 69  Resp:    14  Temp:  98.4 F (36.9 C) 98.2 F (36.8 C)   TempSrc:  Oral Oral   SpO2:  98% 99% 99%  Weight:      Height:       General: alert, cooperative and no distress Lochia: appropriate Uterine Fundus: firm Incision: N/A DVT Evaluation: No evidence of DVT seen on physical exam. Labs: Lab Results  Component Value Date   WBC 11.7 (H) 01/28/2018   HGB 10.1 (L) 01/28/2018   HCT 29.4 (L) 01/28/2018   MCV 89.4 01/28/2018   PLT 267 01/28/2018   CMP Latest Ref Rng & Units 11/26/2016  Glucose 70 - 99 mg/dL 782(N)  BUN 6 - 23 mg/dL 16  Creatinine 5.62 - 1.30 mg/dL 8.65  Sodium 784 - 696 mEq/L 137  Potassium 3.5 - 5.1 mEq/L 4.5  Chloride 96 - 112 mEq/L 108  CO2 19 - 32 mEq/L 21  Calcium 8.4 - 10.5 mg/dL 9.2  Total Protein 6.0 - 8.3 g/dL 7.0  Total Bilirubin 0.2 - 1.2 mg/dL 0.4  Alkaline Phos 39 - 117 U/L 73  AST 0 - 37 U/L 18  ALT 0 - 35 U/L 23    Discharge instruction: per After Visit Summary and "Baby and Me Booklet".  After visit meds:  Allergies as of 01/28/2018      Reactions  Amoxicillin-pot Clavulanate Hives, Rash   Ceclor [cefaclor] Hives, Rash   Eryzole Hives, Rash   Pediazole      Medication List    TAKE these medications   ibuprofen 600 MG tablet Commonly known as:  ADVIL,MOTRIN Take 1 tablet (600 mg total) by mouth every 6 (six) hours.   sertraline 100 MG tablet Commonly known as:  ZOLOFT TAKE 1 TABLET BY MOUTH EVERY DAY - office visit before next refill       Diet: routine diet  Activity: Advance as tolerated. Pelvic rest for 6 weeks.   Outpatient follow up:2 weeks Follow up Appt:No future appointments. Follow up Visit:No follow-ups on file.  Postpartum contraception: Undecided  Newborn Data: Live born female  Birth Weight: 9 lb 2.7 oz (4160 g) APGAR: 8, 9  Newborn Delivery   Birth date/time:  01/27/2018 15:31:00 Delivery type:  Vaginal, Spontaneous     Baby Feeding: Breast Disposition:home with mother   01/28/2018 Sara SellerJennifer M Azilee Pirro, DO

## 2018-01-28 NOTE — Discharge Instructions (Signed)
Vaginal Delivery, Care After °Refer to this sheet in the next few weeks. These instructions provide you with information about caring for yourself after vaginal delivery. Your health care provider may also give you more specific instructions. Your treatment has been planned according to current medical practices, but problems sometimes occur. Call your health care provider if you have any problems or questions. °What can I expect after the procedure? °After vaginal delivery, it is common to have: °· Some bleeding from your vagina. °· Soreness in your abdomen, your vagina, and the area of skin between your vaginal opening and your anus (perineum). °· Pelvic cramps. °· Fatigue. ° °Follow these instructions at home: °Medicines °· Take over-the-counter and prescription medicines only as told by your health care provider. °· If you were prescribed an antibiotic medicine, take it as told by your health care provider. Do not stop taking the antibiotic until it is finished. °Driving ° °· Do not drive or operate heavy machinery while taking prescription pain medicine. °· Do not drive for 24 hours if you received a sedative. °Lifestyle °· Do not drink alcohol. This is especially important if you are breastfeeding or taking medicine to relieve pain. °· Do not use tobacco products, including cigarettes, chewing tobacco, or e-cigarettes. If you need help quitting, ask your health care provider. °Eating and drinking °· Drink at least 8 eight-ounce glasses of water every day unless you are told not to by your health care provider. If you choose to breastfeed your baby, you may need to drink more water than this. °· Eat high-fiber foods every day. These foods may help prevent or relieve constipation. High-fiber foods include: °? Whole grain cereals and breads. °? Brown rice. °? Beans. °? Fresh fruits and vegetables. °Activity °· Return to your normal activities as told by your health care provider. Ask your health care provider  what activities are safe for you. °· Rest as much as possible. Try to rest or take a nap when your baby is sleeping. °· Do not lift anything that is heavier than your baby or 10 lb (4.5 kg) until your health care provider says that it is safe. °· Talk with your health care provider about when you can engage in sexual activity. This may depend on your: °? Risk of infection. °? Rate of healing. °? Comfort and desire to engage in sexual activity. °Vaginal Care °· If you have an episiotomy or a vaginal tear, check the area every day for signs of infection. Check for: °? More redness, swelling, or pain. °? More fluid or blood. °? Warmth. °? Pus or a bad smell. °· Do not use tampons or douches until your health care provider says this is safe. °· Watch for any blood clots that may pass from your vagina. These may look like clumps of dark red, brown, or black discharge. °General instructions °· Keep your perineum clean and dry as told by your health care provider. °· Wear loose, comfortable clothing. °· Wipe from front to back when you use the toilet. °· Ask your health care provider if you can shower or take a bath. If you had an episiotomy or a perineal tear during labor and delivery, your health care provider may tell you not to take baths for a certain length of time. °· Wear a bra that supports your breasts and fits you well. °· If possible, have someone help you with household activities and help care for your baby for at least a few days after   you leave the hospital. °· Keep all follow-up visits for you and your baby as told by your health care provider. This is important. °Contact a health care provider if: °· You have: °? Vaginal discharge that has a bad smell. °? Difficulty urinating. °? Pain when urinating. °? A sudden increase or decrease in the frequency of your bowel movements. °? More redness, swelling, or pain around your episiotomy or vaginal tear. °? More fluid or blood coming from your episiotomy or  vaginal tear. °? Pus or a bad smell coming from your episiotomy or vaginal tear. °? A fever. °? A rash. °? Little or no interest in activities you used to enjoy. °? Questions about caring for yourself or your baby. °· Your episiotomy or vaginal tear feels warm to the touch. °· Your episiotomy or vaginal tear is separating or does not appear to be healing. °· Your breasts are painful, hard, or turn red. °· You feel unusually sad or worried. °· You feel nauseous or you vomit. °· You pass large blood clots from your vagina. If you pass a blood clot from your vagina, save it to show to your health care provider. Do not flush blood clots down the toilet without having your health care provider look at them. °· You urinate more than usual. °· You are dizzy or light-headed. °· You have not breastfed at all and you have not had a menstrual period for 12 weeks after delivery. °· You have stopped breastfeeding and you have not had a menstrual period for 12 weeks after you stopped breastfeeding. °Get help right away if: °· You have: °? Pain that does not go away or does not get better with medicine. °? Chest pain. °? Difficulty breathing. °? Blurred vision or spots in your vision. °? Thoughts about hurting yourself or your baby. °· You develop pain in your abdomen or in one of your legs. °· You develop a severe headache. °· You faint. °· You bleed from your vagina so much that you fill two sanitary pads in one hour. °This information is not intended to replace advice given to you by your health care provider. Make sure you discuss any questions you have with your health care provider. °Document Released: 06/20/2000 Document Revised: 12/05/2015 Document Reviewed: 07/08/2015 °Elsevier Interactive Patient Education © 2018 [employer] ° °

## 2018-01-29 LAB — RH IG WORKUP (INCLUDES ABO/RH)
ABO/RH(D): A NEG
Fetal Screen: NEGATIVE
GESTATIONAL AGE(WKS): 39.2
Unit division: 0

## 2018-02-01 ENCOUNTER — Inpatient Hospital Stay (HOSPITAL_COMMUNITY): Admit: 2018-02-01 | Payer: No Typology Code available for payment source

## 2021-02-04 ENCOUNTER — Encounter (HOSPITAL_BASED_OUTPATIENT_CLINIC_OR_DEPARTMENT_OTHER): Payer: Self-pay

## 2021-02-04 ENCOUNTER — Other Ambulatory Visit: Payer: Self-pay

## 2021-02-04 ENCOUNTER — Emergency Department (HOSPITAL_BASED_OUTPATIENT_CLINIC_OR_DEPARTMENT_OTHER): Payer: No Typology Code available for payment source

## 2021-02-04 ENCOUNTER — Emergency Department (HOSPITAL_BASED_OUTPATIENT_CLINIC_OR_DEPARTMENT_OTHER)
Admission: EM | Admit: 2021-02-04 | Discharge: 2021-02-04 | Disposition: A | Discharge status: Home / Self Care | Payer: No Typology Code available for payment source | Attending: Emergency Medicine | Admitting: Emergency Medicine

## 2021-02-04 DIAGNOSIS — W228XXA Striking against or struck by other objects, initial encounter: Secondary | ICD-10-CM | POA: Diagnosis not present

## 2021-02-04 DIAGNOSIS — F17210 Nicotine dependence, cigarettes, uncomplicated: Secondary | ICD-10-CM | POA: Insufficient documentation

## 2021-02-04 DIAGNOSIS — S91114A Laceration without foreign body of right lesser toe(s) without damage to nail, initial encounter: Secondary | ICD-10-CM | POA: Diagnosis not present

## 2021-02-04 DIAGNOSIS — S99921A Unspecified injury of right foot, initial encounter: Secondary | ICD-10-CM | POA: Diagnosis present

## 2021-02-04 DIAGNOSIS — Z23 Encounter for immunization: Secondary | ICD-10-CM | POA: Insufficient documentation

## 2021-02-04 MED ORDER — BUPIVACAINE HCL (PF) 0.5 % IJ SOLN
10.0000 mL | Freq: Once | INTRAMUSCULAR | Status: AC
  Administered 2021-02-04: 10 mL
  Filled 2021-02-04: qty 10

## 2021-02-04 MED ORDER — TETANUS-DIPHTH-ACELL PERTUSSIS 5-2.5-18.5 LF-MCG/0.5 IM SUSY
0.5000 mL | PREFILLED_SYRINGE | Freq: Once | INTRAMUSCULAR | Status: AC
  Administered 2021-02-04: 0.5 mL via INTRAMUSCULAR
  Filled 2021-02-04: qty 0.5

## 2021-02-04 NOTE — ED Triage Notes (Signed)
Pt states the corner of a fire door went over several toes on her right foot. Small laceration to 2nd toe on right foot. Abrasions noted to 1st & 3rd toe. Unsure of last tetanus.

## 2021-02-04 NOTE — ED Provider Notes (Signed)
[facility] EMERGENCY DEPARTMENT Provider Note   CSN: [id number] Arrival date & time: 02/04/21  5208     History Chief Complaint  Patient presents with   Foot Pain    Sara Reid is a 35 y.o. female.  Pt presents to the ED today with right 2nd toe pain/lac.  She opened a fire door over her foot while wearing sandals.  She sustained a lac to her 2nd toe and some abrasions to her 1st and 3rd toe.  She denies any other injury.      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   PCOS (polycystic ovarian syndrome) 10/02/2009   Depression 10/02/2009   Attention deficit disorder (ADD) in adult 10/02/2009    Past Surgical History:  Procedure Laterality Date   BREAST BIOPSY     benign     OB History     Gravida  2   Para  2   Term  2   Preterm      AB      Living  2      SAB      IAB      Ectopic      Multiple  0   Live Births  2           Family History  Problem Relation Age of Onset   Depression Mother        anxiety   Anxiety disorder Mother    Hypertension Mother    Hypertension Father    Diabetes Maternal Grandmother    Heart disease Paternal Grandfather     Social History   Tobacco Use   Smoking status: Every Day    Packs/day: 0.25    Types: Cigarettes    Last attempt to quit: 01/20/2018    Years since quitting: 3.0   Smokeless tobacco: Never  Substance Use Topics   Alcohol use: No    Alcohol/week: 0.0 standard drinks    Comment: yes outside pregnancy   Drug use: No    Home Medications Prior to Admission  medications   Medication Sig Start Date End Date Taking? Authorizing Provider  ibuprofen (ADVIL,MOTRIN) 600 MG tablet Take 1 tablet (600 mg total) by mouth every 6 (six) hours. 01/28/18   [name], DO  sertraline (ZOLOFT) 100 MG tablet TAKE 1 TABLET BY MOUTH EVERY DAY - office visit before next refill 11/13/17   Shelva Majestic, MD    Allergies    Amoxicillin-pot clavulanate, Ceclor [cefaclor], and Eryzole  Review of Systems   Review of Systems  Skin:  Positive for wound.  All other systems reviewed and are negative.  Physical Exam Updated Vital Signs BP (!) 132/92 (BP Location: Right Arm)   Pulse 87   Temp 98.2 F (36.8 C) (Oral)   Resp (!) 22   Ht 5\' 10"  (1.778 m)   Wt 113.4 kg   SpO2 97%   BMI 35.87 kg/m   Physical Exam Vitals and nursing note reviewed.  Constitutional:      Appearance:  Normal appearance.  HENT:     Head: Normocephalic and atraumatic.     Right Ear: External ear normal.     Left Ear: External ear normal.     Nose: Nose normal.     Mouth/Throat:     Mouth: Mucous membranes are moist.     Pharynx: Oropharynx is clear.  Eyes:     Extraocular Movements: Extraocular movements intact.     Conjunctiva/sclera: Conjunctivae normal.     Pupils: Pupils are equal, round, and reactive to light.  Cardiovascular:     Rate and Rhythm: Normal rate and regular rhythm.     Pulses: Normal pulses.     Heart sounds: Normal heart sounds.  Pulmonary:     Effort: Pulmonary effort is normal.     Breath sounds: Normal breath sounds.  Abdominal:     General: Abdomen is flat. Bowel sounds are normal.     Palpations: Abdomen is soft.  Musculoskeletal:        General: Normal range of motion.     Cervical back: Normal range of motion and neck supple.  Skin:    Capillary Refill: Capillary refill takes less than 2 seconds.     Comments: Lac to right 2nd toe  Neurological:     General: No focal deficit present.     Mental Status: She is alert and oriented to  person, place, and time.  Psychiatric:        Mood and Affect: Mood normal.        Behavior: Behavior normal.    ED Results / Procedures / Treatments   Labs (all labs ordered are listed, but only abnormal results are displayed) Labs Reviewed - No data to display  EKG None  Radiology DG Toe 2nd Right  Result Date: 02/04/2021 CLINICAL DATA:  Pain after injury. EXAM: RIGHT SECOND TOE COMPARISON:  None. FINDINGS: There is no evidence of fracture or dislocation. There is no evidence of arthropathy or other focal bone abnormality. Soft tissues are unremarkable. IMPRESSION: Negative. Electronically Signed   By: Kennith Center M.D.   On: 02/04/2021 10:06    Procedures Procedures   Medications Ordered in ED Medications  bupivacaine (MARCAINE) 0.5 % injection 10 mL (has no administration in time range)  Tdap (BOOSTRIX) injection 0.5 mL (0.5 mLs Intramuscular Given 02/04/21 0931)    ED Course  I have reviewed the triage vital signs and the nursing notes.  Pertinent labs & imaging results that were available during my care of the patient were reviewed by me and considered in my medical decision making (see chart for details).    MDM Rules/Calculators/A&P                           X-ray is neg for fx.  Tetanus updated.  Lac repaired by PA Joldersma.  Pt is stable for d/c.  Return if worse. Final Clinical Impression(s) / ED Diagnoses Final diagnoses:  Laceration of lesser toe of right foot without foreign body present or damage to nail, initial encounter    Rx / DC Orders ED Discharge Orders     None        Jacalyn Lefevre, MD 02/04/21 1030

## 2021-02-04 NOTE — ED Provider Notes (Signed)
LACERATION REPAIR Performed by: Placido Sou Consent: Verbal consent obtained. Risks and benefits: risks, benefits and alternatives were discussed Patient identity confirmed: provided demographic data Time out performed prior to procedure Prepped and Draped in normal sterile fashion Wound explored Laceration Location: right 2nd toe Laceration Length: 3cm No Foreign Bodies seen or palpated Anesthesia: local infiltration Local anesthetic: lidocaine 1% without epinephrine Anesthetic total: 2 ml Irrigation method: syringe Amount of cleaning: standard Skin closure: sutures Number of sutures or staples: 3 Technique: simple interrupted  Patient tolerance: Patient tolerated the procedure well with no immediate complications.    Placido Sou, PA-C 02/04/21 1032    Jacalyn Lefevre, MD 02/04/21 1247

## 2022-06-06 ENCOUNTER — Other Ambulatory Visit (HOSPITAL_BASED_OUTPATIENT_CLINIC_OR_DEPARTMENT_OTHER): Payer: Self-pay

## 2022-06-06 MED ORDER — LISDEXAMFETAMINE DIMESYLATE 50 MG PO CAPS
50.0000 mg | ORAL_CAPSULE | Freq: Every morning | ORAL | 0 refills | Status: AC
  Filled 2022-06-06: qty 30, 30d supply, fill #0

## 2022-07-08 ENCOUNTER — Other Ambulatory Visit (HOSPITAL_BASED_OUTPATIENT_CLINIC_OR_DEPARTMENT_OTHER): Payer: Self-pay

## 2023-02-08 ENCOUNTER — Emergency Department (HOSPITAL_BASED_OUTPATIENT_CLINIC_OR_DEPARTMENT_OTHER)
Admission: EM | Admit: 2023-02-08 | Discharge: 2023-02-08 | Disposition: A | Discharge status: Home / Self Care | Payer: No Typology Code available for payment source | Source: Home / Self Care | Attending: Emergency Medicine | Admitting: Emergency Medicine

## 2023-02-08 ENCOUNTER — Encounter (HOSPITAL_BASED_OUTPATIENT_CLINIC_OR_DEPARTMENT_OTHER): Payer: Self-pay | Admitting: Emergency Medicine

## 2023-02-08 ENCOUNTER — Other Ambulatory Visit: Payer: Self-pay

## 2023-02-08 DIAGNOSIS — M5442 Lumbago with sciatica, left side: Secondary | ICD-10-CM

## 2023-02-08 DIAGNOSIS — M25512 Pain in left shoulder: Secondary | ICD-10-CM | POA: Insufficient documentation

## 2023-02-08 DIAGNOSIS — F17210 Nicotine dependence, cigarettes, uncomplicated: Secondary | ICD-10-CM | POA: Insufficient documentation

## 2023-02-08 DIAGNOSIS — L03313 Cellulitis of chest wall: Secondary | ICD-10-CM | POA: Insufficient documentation

## 2023-02-08 DIAGNOSIS — L02213 Cutaneous abscess of chest wall: Secondary | ICD-10-CM | POA: Diagnosis not present

## 2023-02-08 DIAGNOSIS — L0291 Cutaneous abscess, unspecified: Secondary | ICD-10-CM | POA: Diagnosis not present

## 2023-02-08 DIAGNOSIS — D72829 Elevated white blood cell count, unspecified: Secondary | ICD-10-CM | POA: Insufficient documentation

## 2023-02-08 LAB — CBC WITH DIFFERENTIAL/PLATELET
Abs Immature Granulocytes: 0.04 10*3/uL (ref 0.00–0.07)
Basophils Absolute: 0 10*3/uL (ref 0.0–0.1)
Basophils Relative: 0 %
Eosinophils Absolute: 0.1 10*3/uL (ref 0.0–0.5)
Eosinophils Relative: 1 %
HCT: 40.1 % (ref 36.0–46.0)
Hemoglobin: 13.8 g/dL (ref 12.0–15.0)
Immature Granulocytes: 0 %
Lymphocytes Relative: 7 %
Lymphs Abs: 0.9 10*3/uL (ref 0.7–4.0)
MCH: 30.3 pg (ref 26.0–34.0)
MCHC: 34.4 g/dL (ref 30.0–36.0)
MCV: 87.9 fL (ref 80.0–100.0)
Monocytes Absolute: 0.8 10*3/uL (ref 0.1–1.0)
Monocytes Relative: 6 %
Neutro Abs: 11 10*3/uL — ABNORMAL HIGH (ref 1.7–7.7)
Neutrophils Relative %: 86 %
Platelets: 282 10*3/uL (ref 150–400)
RBC: 4.56 MIL/uL (ref 3.87–5.11)
RDW: 12.6 % (ref 11.5–15.5)
WBC: 12.8 10*3/uL — ABNORMAL HIGH (ref 4.0–10.5)
nRBC: 0 % (ref 0.0–0.2)

## 2023-02-08 LAB — BASIC METABOLIC PANEL
Anion gap: 12 (ref 5–15)
BUN: 15 mg/dL (ref 6–20)
CO2: 20 mmol/L — ABNORMAL LOW (ref 22–32)
Calcium: 8.8 mg/dL — ABNORMAL LOW (ref 8.9–10.3)
Chloride: 103 mmol/L (ref 98–111)
Creatinine, Ser: 0.44 mg/dL (ref 0.44–1.00)
GFR, Estimated: >60 mL/min (ref >60)
Glucose, Bld: 135 mg/dL — ABNORMAL HIGH (ref 70–99)
Potassium: 4.2 mmol/L (ref 3.5–5.1)
Sodium: 135 mmol/L (ref 135–145)

## 2023-02-08 MED ORDER — CLINDAMYCIN HCL 300 MG PO CAPS: 300.0000 mg | ORAL_CAPSULE | Freq: Three times a day (TID) | ORAL | 0 refills | Status: DC

## 2023-02-08 MED ORDER — PREDNISONE 10 MG (21) PO TBPK: ORAL_TABLET | Freq: Every day | ORAL | 0 refills | Status: DC

## 2023-02-08 MED ORDER — HYDROCODONE-ACETAMINOPHEN 5-325 MG PO TABS
1.0000 | ORAL_TABLET | Freq: Once | ORAL | Status: AC
  Administered 2023-02-08: 1 via ORAL
  Filled 2023-02-08: qty 1

## 2023-02-08 MED ORDER — CYCLOBENZAPRINE HCL 10 MG PO TABS: 10.0000 mg | ORAL_TABLET | Freq: Two times a day (BID) | ORAL | 0 refills | Status: DC | PRN

## 2023-02-08 MED ORDER — KETOROLAC TROMETHAMINE 30 MG/ML IJ SOLN
30.0000 mg | Freq: Once | INTRAMUSCULAR | Status: AC
  Administered 2023-02-08: 30 mg via INTRAMUSCULAR
  Filled 2023-02-08: qty 1

## 2023-02-08 NOTE — ED Triage Notes (Signed)
Pt presents to ED POV from home. Pt c/o L shoulder pain since last night and L leg pain x1w. Pt reports that she was recently treated for cellulitis in shoulder. Took antibiotics, saw improvement and then worsened last night. Pt reports leg pain is throbbing and sharp. Denies insult/injury. Pain began in hip and then started radiating down.

## 2023-02-08 NOTE — Discharge Instructions (Signed)
As discussed, we will treat your skin infection with antibiotic called clindamycin.  This is a very broad-spectrum antibiotic with great coverage for skin and soft tissue.  Will treat your back pain with prednisone as well as muscle laxer.  Recommend follow-up with primary care for reassessment of your symptoms.  Please do not hesitate to return to the emergency department for worrisome signs and symptoms we discussed become apparent.

## 2023-02-08 NOTE — ED Provider Notes (Signed)
[place] EMERGENCY DEPARTMENT AT [facility] Provider Note   CSN: [id number] Arrival date & time: 02/08/23  8657     History  Chief Complaint  Patient presents with   Leg Pain   Shoulder Pain    Sara Reid is a 37 y.o. female.   Leg Pain Shoulder Pain   37 year old female presents emergency department with complaints of left low back/leg pain as well as left shoulder pain/redness.  Regarding left low back/leg pain, symptoms began approximately 1 week ago when she was standing at her kitchen counter.  Reports pain radiating from her left low back down her left leg to the back of her knee.  States the pain is worsened with exertion and relieved with rest.  Has been taking at home ibuprofen which is help with her symptoms some as well as some left upper muscle laxer.  Denies any weakness or sensory deficits in the legs, fever, history of IV drug use, saddle anesthesia, bowel/bladder dysfunction.  Denies any known trauma to affected leg.  Reports history of SI joint issues on side but did not have radicular symptoms with prior diagnosis.  Patient also states that she is concerned for recurrence of cellulitis of her left chest.  States that she was treated on 7/20 with doxycycline for cellulitis after suffering a bug bite to her left lateral shoulder.  States that she noticed redness began again yesterday.  Denies any repeat bite or trauma.  States that area is tender and warm to the touch.  Denies any fever, chills, nausea, vomiting, pain with movement of her left shoulder/arm.  Past medical history significant for ADHD, depression, anxiety, PCOS, ADD  Home Medications Prior to Admission medications   Medication Sig Start Date End Date Taking? Authorizing Provider  clindamycin (CLEOCIN) 300 MG capsule Take 1 capsule (300 mg total) by mouth 3 (three) times daily for 7 days. 02/08/23 02/15/23 Yes Sherian Maroon A, PA  cyclobenzaprine (FLEXERIL) 10 MG tablet Take 1 tablet  (10 mg total) by mouth 2 (two) times daily as needed for muscle spasms. 02/08/23  Yes Sherian Maroon A, PA  predniSONE (STERAPRED UNI-PAK 21 TAB) 10 MG (21) TBPK tablet Take by mouth daily. Take 6 tabs by mouth daily  for 2 days, then 5 tabs for 2 days, then 4 tabs for 2 days, then 3 tabs for 2 days, 2 tabs for 2 days, then 1 tab by mouth daily for 2 days 02/08/23  Yes Sherian Maroon A, PA  ibuprofen (ADVIL,MOTRIN) 600 MG tablet Take 1 tablet (600 mg total) by mouth every 6 (six) hours. 01/28/18   [name], DO  lisdexamfetamine (VYVANSE) 50 MG capsule Take 1 capsule by mouth every morning. 06/06/22     sertraline (ZOLOFT) 100 MG tablet TAKE 1 TABLET BY MOUTH EVERY DAY - office visit before next refill 11/13/17   Shelva Majestic, MD      Allergies    Amoxicillin-pot clavulanate, Ceclor [cefaclor], and Eryzole    Review of Systems   Review of Systems  All other systems reviewed and are negative.   Physical Exam Updated Vital Signs BP (!) 142/87   Pulse 89   Temp 98.6 F (37 C) (Oral)   Resp 20   SpO2 99%  Physical Exam Vitals and nursing note reviewed.  Constitutional:      General: She is not in acute distress.    Appearance: She is well-developed.  HENT:     Head: Normocephalic and atraumatic.  Eyes:  Conjunctiva/sclera: Conjunctivae normal.  Cardiovascular:     Rate and Rhythm: Normal rate and regular rhythm.     Heart sounds: No murmur heard. Pulmonary:     Effort: Pulmonary effort is normal. No respiratory distress.     Breath sounds: Normal breath sounds.  Abdominal:     Palpations: Abdomen is soft.     Tenderness: There is no abdominal tenderness.  Musculoskeletal:        General: No swelling.     Cervical back: Neck supple.     Comments: No midline tenderness of cervical, thoracic, lumbar spine with no obvious step-off or deformity noted.  Paraspinal tenderness in the left lower lumbar region with radiation to left buttock.  Straight leg raise positive on left.   Muscular strength 5 out of 5 bilaterally for ankle dorsi/plantarflexion, knee flexion/extension, hip flexion/extension.  DTR symmetric at patella.  Pedal pulses 2+ bilaterally.  Patient with erythematous skin changes to the left upper lateral chest.  Area warm to the touch as well as tender to palpation.  No pain with ranging her left shoulder.  No obvious palpable fluctuance.  Skin:    General: Skin is warm and dry.     Capillary Refill: Capillary refill takes less than 2 seconds.  Neurological:     Mental Status: She is alert.  Psychiatric:        Mood and Affect: Mood normal.     ED Results / Procedures / Treatments   Labs (all labs ordered are listed, but only abnormal results are displayed) Labs Reviewed  CBC WITH DIFFERENTIAL/PLATELET - Abnormal; Notable for the following components:      Result Value   WBC 12.8 (*)    Neutro Abs 11.0 (*)    All other components within normal limits  BASIC METABOLIC PANEL - Abnormal; Notable for the following components:   CO2 20 (*)    Glucose, Bld 135 (*)    Calcium 8.8 (*)    All other components within normal limits    EKG None  Radiology No results found.  Procedures Procedures    Medications Ordered in ED Medications  ketorolac (TORADOL) 30 MG/ML injection 30 mg (30 mg Intramuscular Given 02/08/23 1106)    ED Course/ Medical Decision Making/ A&P                                 Medical Decision Making Amount and/or Complexity of Data Reviewed Labs: ordered.  Risk Prescription drug management.   This patient presents to the ED for concern of left low back/leg pain, cellulitis, this involves an extensive number of treatment options, and is a complaint that carries with it a high risk of complications and morbidity.  The differential diagnosis includes septic arthritis, cellulitis, erysipelas, necrotizing fasciitis, abscess, fracture, strain/pain, dislocation, sciatica, cauda equina, spinal epidural abscess, aortic  dissection, nephrolithiasis, pyelonephritis   Co morbidities that complicate the patient evaluation  See HPI   Additional history obtained:  Additional history obtained from EMR External records from outside source obtained and reviewed including hospital records   Lab Tests:  Triage staff ordered, and personally interpreted labs.  The pertinent results include: Leukocytosis of 12.8.  No evidence of anemia.  Platelets within range.  Mild decrease in bicarb of 20 as well as hypocalcemia of 8.8 but otherwise, electrolytes within normal limits.  No renal dysfunction.   Imaging Studies ordered:  na   Cardiac Monitoring: / EKG:  The patient was maintained on a cardiac monitor.  I personally viewed and interpreted the cardiac monitored which showed an underlying rhythm of: Sinus rhythm   Consultations Obtained:  N/a   Problem List / ED Course / Critical interventions / Medication management  Left low back pain with sciatica, cellulitis I ordered medication including Toradol  Reevaluation of the patient after these medicines showed that the patient improved I have reviewed the patients home medicines and have made adjustments as needed   Social Determinants of Health:  Chronic cigarette use.  Denies illicit drug use.   Test / Admission - Considered:  Left low back pain with sciatica, cellulitis Vitals signs significant for mild hypertension blood pressure 142/87. Otherwise within normal range and stable throughout visit. Laboratory studies significant for: See above 37 year old female presents emergency department with complaints of left low back pain with radiation down the left leg as well as red area to her left shoulder/chest.  Regarding back pain, symptoms most consistent with left-sided sciatica.  No CVA tenderness, urinary symptoms; low suspicion for pyelonephritis/nephrolithiasis.  Patient without pulse deficits, abdominal pain with radiation to back, neurologic  deficits, significant hypertension; low suspicion for aortic dissection.  Patient without traumatic mechanism with full range of motion of joints of lower extremities; low suspicion for acute fracture/dislocation.  Patient without red flag signs for back pain on HPI/PE; low suspicion for spinal epidural abscess, cauda equina or other spinal cord injury.  Will treat patient's pain in the outpatient setting with NSAIDs and muscle relaxer as needed.  Regarding patient's redness of left chest/shoulder, symptoms most consistent with cellulitis.  Patient without meeting of SIRS criteria so sepsis protocol was not performed.  No palpable abscess.  No indication clinically of septic arthritis.  Will treat with antibiotics in outpatient setting.  Will recommend follow-up with primary care in the outpatient setting for reevaluation of symptoms.  Treatment plan discussed at length with patient and she did not understanding was agreeable to said plan.  Patient overall well-appearing, afebrile in no acute distress. Worrisome signs and symptoms were discussed with the patient, and the patient acknowledged understanding to return to the ED if noticed. Patient was stable upon discharge.          Final Clinical Impression(s) / ED Diagnoses Final diagnoses:  Acute left-sided low back pain with left-sided sciatica  Cellulitis of chest wall    Rx / DC Orders ED Discharge Orders     None         Peter Garter, [place] 02/08/23 1111    Vanetta Mulders, MD 02/08/23 [phone]

## 2023-02-11 ENCOUNTER — Encounter (HOSPITAL_BASED_OUTPATIENT_CLINIC_OR_DEPARTMENT_OTHER): Payer: Self-pay | Admitting: Emergency Medicine

## 2023-02-11 ENCOUNTER — Other Ambulatory Visit: Payer: Self-pay

## 2023-02-11 ENCOUNTER — Other Ambulatory Visit (HOSPITAL_BASED_OUTPATIENT_CLINIC_OR_DEPARTMENT_OTHER): Payer: Self-pay

## 2023-02-11 ENCOUNTER — Emergency Department (HOSPITAL_BASED_OUTPATIENT_CLINIC_OR_DEPARTMENT_OTHER): Payer: No Typology Code available for payment source

## 2023-02-11 ENCOUNTER — Inpatient Hospital Stay (HOSPITAL_BASED_OUTPATIENT_CLINIC_OR_DEPARTMENT_OTHER)
Admission: EM | Admit: 2023-02-11 | Discharge: 2023-02-14 | DRG: 603 | Disposition: A | Discharge status: Home / Self Care | Payer: No Typology Code available for payment source | Attending: Internal Medicine | Admitting: Internal Medicine

## 2023-02-11 ENCOUNTER — Emergency Department (HOSPITAL_BASED_OUTPATIENT_CLINIC_OR_DEPARTMENT_OTHER): Payer: No Typology Code available for payment source | Admitting: Radiology

## 2023-02-11 DIAGNOSIS — Z880 Allergy status to penicillin: Secondary | ICD-10-CM

## 2023-02-11 DIAGNOSIS — Z8249 Family history of ischemic heart disease and other diseases of the circulatory system: Secondary | ICD-10-CM

## 2023-02-11 DIAGNOSIS — F32A Depression, unspecified: Secondary | ICD-10-CM | POA: Diagnosis present

## 2023-02-11 DIAGNOSIS — Z8614 Personal history of Methicillin resistant Staphylococcus aureus infection: Secondary | ICD-10-CM | POA: Diagnosis not present

## 2023-02-11 DIAGNOSIS — F17210 Nicotine dependence, cigarettes, uncomplicated: Secondary | ICD-10-CM | POA: Diagnosis present

## 2023-02-11 DIAGNOSIS — Z6834 Body mass index (BMI) 34.0-34.9, adult: Secondary | ICD-10-CM | POA: Diagnosis not present

## 2023-02-11 DIAGNOSIS — Z881 Allergy status to other antibiotic agents status: Secondary | ICD-10-CM

## 2023-02-11 DIAGNOSIS — L03114 Cellulitis of left upper limb: Secondary | ICD-10-CM | POA: Diagnosis present

## 2023-02-11 DIAGNOSIS — L02213 Cutaneous abscess of chest wall: Principal | ICD-10-CM | POA: Diagnosis present

## 2023-02-11 DIAGNOSIS — M5410 Radiculopathy, site unspecified: Secondary | ICD-10-CM

## 2023-02-11 DIAGNOSIS — F419 Anxiety disorder, unspecified: Secondary | ICD-10-CM | POA: Diagnosis present

## 2023-02-11 DIAGNOSIS — W57XXXA Bitten or stung by nonvenomous insect and other nonvenomous arthropods, initial encounter: Secondary | ICD-10-CM | POA: Diagnosis present

## 2023-02-11 DIAGNOSIS — E6609 Other obesity due to excess calories: Secondary | ICD-10-CM

## 2023-02-11 DIAGNOSIS — Z7952 Long term (current) use of systemic steroids: Secondary | ICD-10-CM | POA: Diagnosis not present

## 2023-02-11 DIAGNOSIS — M5416 Radiculopathy, lumbar region: Secondary | ICD-10-CM | POA: Diagnosis present

## 2023-02-11 DIAGNOSIS — Z818 Family history of other mental and behavioral disorders: Secondary | ICD-10-CM

## 2023-02-11 DIAGNOSIS — F909 Attention-deficit hyperactivity disorder, unspecified type: Secondary | ICD-10-CM | POA: Diagnosis present

## 2023-02-11 DIAGNOSIS — F325 Major depressive disorder, single episode, in full remission: Secondary | ICD-10-CM

## 2023-02-11 DIAGNOSIS — M25552 Pain in left hip: Secondary | ICD-10-CM | POA: Diagnosis present

## 2023-02-11 DIAGNOSIS — Z79899 Other long term (current) drug therapy: Secondary | ICD-10-CM | POA: Diagnosis not present

## 2023-02-11 DIAGNOSIS — Z87442 Personal history of urinary calculi: Secondary | ICD-10-CM | POA: Diagnosis not present

## 2023-02-11 DIAGNOSIS — L03313 Cellulitis of chest wall: Secondary | ICD-10-CM | POA: Diagnosis present

## 2023-02-11 DIAGNOSIS — L0291 Cutaneous abscess, unspecified: Secondary | ICD-10-CM | POA: Diagnosis present

## 2023-02-11 DIAGNOSIS — E669 Obesity, unspecified: Secondary | ICD-10-CM | POA: Diagnosis present

## 2023-02-11 LAB — CBC WITH DIFFERENTIAL/PLATELET
Abs Immature Granulocytes: 0.06 10*3/uL (ref 0.00–0.07)
Basophils Absolute: 0 10*3/uL (ref 0.0–0.1)
Basophils Relative: 0 %
Eosinophils Absolute: 0.2 10*3/uL (ref 0.0–0.5)
Eosinophils Relative: 3 %
HCT: 38.3 % (ref 36.0–46.0)
Hemoglobin: 13.2 g/dL (ref 12.0–15.0)
Immature Granulocytes: 1 %
Lymphocytes Relative: 10 %
Lymphs Abs: 0.9 10*3/uL (ref 0.7–4.0)
MCH: 30.3 pg (ref 26.0–34.0)
MCHC: 34.5 g/dL (ref 30.0–36.0)
MCV: 88 fL (ref 80.0–100.0)
Monocytes Absolute: 0.6 10*3/uL (ref 0.1–1.0)
Monocytes Relative: 6 %
Neutro Abs: 7.5 10*3/uL (ref 1.7–7.7)
Neutrophils Relative %: 80 %
Platelets: 324 10*3/uL (ref 150–400)
RBC: 4.35 MIL/uL (ref 3.87–5.11)
RDW: 12.9 % (ref 11.5–15.5)
WBC: 9.3 10*3/uL (ref 4.0–10.5)
nRBC: 0 % (ref 0.0–0.2)

## 2023-02-11 LAB — BASIC METABOLIC PANEL
Anion gap: 9 (ref 5–15)
BUN: 19 mg/dL (ref 6–20)
CO2: 23 mmol/L (ref 22–32)
Calcium: 9.1 mg/dL (ref 8.9–10.3)
Chloride: 104 mmol/L (ref 98–111)
Creatinine, Ser: 0.51 mg/dL (ref 0.44–1.00)
GFR, Estimated: >60 mL/min (ref >60)
Glucose, Bld: 186 mg/dL — ABNORMAL HIGH (ref 70–99)
Potassium: 3.7 mmol/L (ref 3.5–5.1)
Sodium: 136 mmol/L (ref 135–145)

## 2023-02-11 MED ORDER — PREDNISONE 5 MG PO TABS: 10.0000 mg | ORAL_TABLET | Freq: Every day | ORAL | Status: DC

## 2023-02-11 MED ORDER — PREDNISONE 20 MG PO TABS: 40.0000 mg | ORAL_TABLET | Freq: Every day | ORAL | Status: DC

## 2023-02-11 MED ORDER — PREDNISONE 10 MG (21) PO TBPK: ORAL_TABLET | Freq: Every day | ORAL | Status: DC

## 2023-02-11 MED ORDER — PREDNISONE 5 MG PO TABS: 30.0000 mg | ORAL_TABLET | Freq: Every day | ORAL | Status: DC

## 2023-02-11 MED ORDER — IOHEXOL 300 MG/ML  SOLN
100.0000 mL | Freq: Once | INTRAMUSCULAR | Status: AC | PRN
  Administered 2023-02-11: 75 mL via INTRAVENOUS

## 2023-02-11 MED ORDER — VANCOMYCIN HCL IN DEXTROSE 1-5 GM/200ML-% IV SOLN
1000.0000 mg | Freq: Once | INTRAVENOUS | Status: AC
  Administered 2023-02-11: 1000 mg via INTRAVENOUS
  Filled 2023-02-11: qty 200

## 2023-02-11 MED ORDER — VANCOMYCIN HCL 1250 MG/250ML IV SOLN
1250.0000 mg | Freq: Two times a day (BID) | INTRAVENOUS | Status: DC
  Administered 2023-02-12 – 2023-02-13 (×3): 1250 mg via INTRAVENOUS
  Filled 2023-02-11 (×5): qty 250

## 2023-02-11 MED ORDER — KETOROLAC TROMETHAMINE 15 MG/ML IJ SOLN
15.0000 mg | Freq: Once | INTRAMUSCULAR | Status: AC
  Administered 2023-02-11: 15 mg via INTRAVENOUS
  Filled 2023-02-11: qty 1

## 2023-02-11 MED ORDER — OXYCODONE-ACETAMINOPHEN 5-325 MG PO TABS
1.0000 | ORAL_TABLET | Freq: Once | ORAL | Status: AC
  Administered 2023-02-11: 1 via ORAL
  Filled 2023-02-11: qty 1

## 2023-02-11 MED ORDER — SERTRALINE HCL 100 MG PO TABS
100.0000 mg | ORAL_TABLET | Freq: Every day | ORAL | Status: DC
  Administered 2023-02-12 – 2023-02-14 (×3): 100 mg via ORAL
  Filled 2023-02-11 (×3): qty 1

## 2023-02-11 MED ORDER — IBUPROFEN 400 MG PO TABS
600.0000 mg | ORAL_TABLET | Freq: Four times a day (QID) | ORAL | Status: DC | PRN
  Administered 2023-02-11: 600 mg via ORAL
  Filled 2023-02-11: qty 1

## 2023-02-11 MED ORDER — PREDNISONE 20 MG PO TABS: 20.0000 mg | ORAL_TABLET | Freq: Every day | ORAL | Status: DC

## 2023-02-11 NOTE — Consult Note (Incomplete)
Consult Note  [facility] Sep 25, 1985  [id number].    Requesting MD: Renne Crigler, PA-C Chief Complaint/Reason for Consult: Left chest wall cellulitis/abscess HPI:  Patient is a 37 year old female who presented to [facility] with left chest wall pain/redness with concern for worsening cellulitis. She was treated with PO doxycyline. All of this started with a bug bite to the shoulder back on 7/20. Started noted increasing redness again 8/3. Was seen at [facility] 8/4 but no signs of sepsis or abscess and was instructed to follow up with PCP. Now redness and swelling is more widespread over lateral chest wall and axilla. Reportedly she was swimming in the sound at [facility] just prior to this and her husband who was with her also developed some sort of skin infection.   PMH otherwise significant for Depression, anxiety, ADHD. Allergies to augmentin, ceclor and eryzole. Not on any blood thinners.   ROS: Negative other than HPI  Family History  Problem Relation Age of Onset   Depression Mother        anxiety   Anxiety disorder Mother    Hypertension Mother    Hypertension Father    Diabetes Maternal Grandmother    Heart disease Paternal Grandfather     Past Medical History:  Diagnosis Date   ADHD    Anxiety    Depression    Kidney stone complicating pregnancy 02/16/2015   Neonatal jaundice    UTI (lower urinary tract infection)     Past Surgical History:  Procedure Laterality Date   BREAST BIOPSY     benign    Social History:  reports that she has been smoking cigarettes. She has never used smokeless tobacco. She reports current alcohol use. She reports current drug use. Drug: Marijuana.  Allergies:  Allergies  Allergen Reactions   Amoxicillin-Pot Clavulanate Hives and Rash   Ceclor [Cefaclor] Hives and Rash   Eryzole Hives and Rash    Pediazole    (Not in a hospital admission)   Blood pressure 115/80, pulse 82, temperature 98.5 F (36.9 C),  temperature source Oral, resp. rate 16, last menstrual period 01/24/2023, SpO2 99%, unknown if currently breastfeeding. Physical Exam:  General: pleasant, WD, *** female who is laying in bed in NAD*** HEENT: head is normocephalic, atraumatic.  Sclera are noninjected.  PERRL.  Ears and nose without any masses or lesions.  Mouth is pink and moist Heart: regular, rate, and rhythm.  Normal s1,s2. No obvious murmurs, gallops, or rubs noted.  Palpable radial and pedal pulses bilaterally Lungs: CTAB, no wheezes, rhonchi, or rales noted.  Respiratory effort nonlabored Abd: ***soft, NT, ND, +BS, no masses, hernias, or organomegaly MS: all 4 extremities are symmetrical with no cyanosis, clubbing, or edema. Skin: warm and dry with no masses, lesions, or rashes Neuro: Cranial nerves 2-12 grossly intact, sensation is normal throughout Psych: A&Ox3 with an appropriate affect.   Results for orders placed or performed during the hospital encounter of 02/11/23 (from the past 48 hour(s))  CBC with Differential     Status: None   Collection Time: 02/11/23 12:07 PM  Result Value Ref Range   WBC 9.3 4.0 - 10.5 K/uL   RBC 4.35 3.87 - 5.11 MIL/uL   Hemoglobin 13.2 12.0 - 15.0 g/dL   HCT 78.2 95.6 - 21.3 %   MCV 88.0 80.0 - 100.0 fL   MCH 30.3 26.0 - 34.0 pg   MCHC 34.5 30.0 - 36.0 g/dL   RDW 08.6 57.8 -  15.5 %   Platelets 324 150 - 400 K/uL   nRBC 0.0 0.0 - 0.2 %   Neutrophils Relative % 80 %   Neutro Abs 7.5 1.7 - 7.7 K/uL   Lymphocytes Relative 10 %   Lymphs Abs 0.9 0.7 - 4.0 K/uL   Monocytes Relative 6 %   Monocytes Absolute 0.6 0.1 - 1.0 K/uL   Eosinophils Relative 3 %   Eosinophils Absolute 0.2 0.0 - 0.5 K/uL   Basophils Relative 0 %   Basophils Absolute 0.0 0.0 - 0.1 K/uL   Immature Granulocytes 1 %   Abs Immature Granulocytes 0.06 0.00 - 0.07 K/uL    Comment: Performed at [facility], [address], [place], [place] [address]  Basic metabolic panel     Status: Abnormal    Collection Time: 02/11/23 12:07 PM  Result Value Ref Range   Sodium 136 135 - 145 mmol/L   Potassium 3.7 3.5 - 5.1 mmol/L   Chloride 104 98 - 111 mmol/L   CO2 23 22 - 32 mmol/L   Glucose, Bld 186 (H) 70 - 99 mg/dL    Comment: Glucose reference range applies only to samples taken after fasting for at least 8 hours.   BUN 19 6 - 20 mg/dL   Creatinine, Ser 6.04 0.44 - 1.00 mg/dL   Calcium 9.1 8.9 - 54.0 mg/dL   GFR, Estimated >98 >11 mL/min    Comment: (NOTE) Calculated using the CKD-EPI Creatinine Equation (2021)    Anion gap 9 5 - 15    Comment: Performed at [facility], [address] [place], [place] [address]   CT Chest W Contrast  Result Date: 02/11/2023 CLINICAL DATA:  Soft tissue infection suspected, chest, xray done complex left-sided chest wall abscess EXAM: CT CHEST WITH CONTRAST TECHNIQUE: Multidetector CT imaging of the chest was performed during intravenous contrast administration. RADIATION DOSE REDUCTION: This exam was performed according to the departmental dose-optimization program which includes automated exposure control, adjustment of the mA and/or kV according to patient size and/or use of iterative reconstruction technique. CONTRAST:  75mL OMNIPAQUE IOHEXOL 300 MG/ML  SOLN COMPARISON:  None Available. FINDINGS: Cardiovascular: Normal cardiac size. No pericardial effusion. No aortic aneurysm. Mediastinum/Nodes: Visualized thyroid gland appears grossly unremarkable. No solid / cystic mediastinal masses. The esophagus is nondistended precluding optimal assessment. No axillary, mediastinal or hilar lymphadenopathy by size criteria. Lungs/Pleura: The central tracheo-bronchial tree is patent. No mass or consolidation. No pleural effusion or pneumothorax. No suspicious lung nodules. Upper Abdomen: There are mild diffuse thickening of the left adrenal gland without discrete nodule. There are punctate calcifications. Findings may represent sequela of prior  infection, inflammation or hemorrhage. Unremarkable right adrenal gland. Remaining visualized upper abdominal viscera within normal limits. Musculoskeletal: There is mild asymmetric thickening of the left pectoralis major muscle which is only partially imaged on this exam. There is fat stranding along the surface of the muscle. However, no discrete mass or abscess/collection seen. Consider examination of the left shoulder, if clinically warranted. No suspicious osseous lesions. IMPRESSION: 1. Mild asymmetric thickening of the left pectoralis major muscle which is only partially imaged on this exam. There is fat stranding along the surface of the muscle. However, no discrete mass or abscess/collection seen. Consider examination of the left shoulder, if clinically warranted. 2. Mild diffuse thickening of the left adrenal gland without discrete nodule. There are punctate calcifications. Findings may represent sequela of prior infection, inflammation or hemorrhage. 3. Multiple other nonacute observations, as described above.  Electronically Signed   By: Jules Schick M.D.   On: 02/11/2023 15:03   [place] CHEST SOFT TISSUE  Result Date: 02/11/2023 CLINICAL DATA:  Anterior left shoulder swelling, cellulitis EXAM: ULTRASOUND LEFT UPPER EXTREMITY LIMITED TECHNIQUE: Ultrasound examination of the upper extremity soft tissues was performed in the area of clinical concern. COMPARISON:  None Available. FINDINGS: Targeted ultrasound was performed of the soft tissues of the anterior left shoulder at site of patient's clinical concern. Large complex fluid collection within the deep subcutaneous soft tissues at this location measuring 5.8 x 2.5 x 5.7 cm. Posterior through transmission. No internal vascularity. Increased echogenicity of the adjacent fat lobules with edema and hyperemia. IMPRESSION: Large complex fluid collection within the deep subcutaneous soft tissues of the anterior left shoulder measuring 5.8 x 2.5 x 5.7 cm.  Appearance most suggestive of abscess with surrounding cellulitis. Electronically Signed   By: Duanne Guess D.O.   On: 02/11/2023 11:51   DG Lumbar Spine Complete  Result Date: 02/11/2023 CLINICAL DATA:  left hip and leg pain, no injury EXAM: LUMBAR SPINE - COMPLETE 4+ VIEW COMPARISON:  06/01/2012 FINDINGS: There is no evidence of lumbar spine fracture. Alignment is normal. Progressed disc height loss at L4-L5 and minimal disc height loss at L5-S1. The remaining intervertebral disc spaces are maintained. IMPRESSION: Progressed disc height loss at L4-L5 and minimal disc height loss at L5-S1. No acute findings. Electronically Signed   By: Duanne Guess D.O.   On: 02/11/2023 11:48   DG Hip Unilat W or Wo Pelvis 2-3 Views Left  Result Date: 02/11/2023 CLINICAL DATA:  One-week history of left hip and leg pain. No known injury. EXAM: DG HIP (WITH OR WITHOUT PELVIS) 3V LEFT COMPARISON:  None Available. FINDINGS: There is no evidence of hip fracture or dislocation. Mild lateral acetabular over coverage. IMPRESSION: 1. No acute fracture or dislocation. 2. Mild lateral acetabular over coverage, which can be seen in the setting of pincer-type femoral acetabular impingement. Electronically Signed   By: Agustin Cree M.D.   On: 02/11/2023 11:47      Assessment/Plan L chest wall/shoulder cellulitis with abscess - worsening cellulitis on PO abx - [place] with large complex fluid collection within the deep subcutaneous soft tissues of the anterior left shoulder measuring 5.8 x 2.5 x 5.7 cm. - CT without abscess asymmetric thickening of the left pectoralis major muscle which is only partially imaged on this exam - ED provider felt that CT did not fully image area secondary to positioning - hemodynamically stable, WBC 9.3 from 12.8 on 8/4  FEN: *** VTE: *** ID: [id number]  Agree with medical admission for IV abx. ***  I reviewed {Reviewed data:26882::"last 24 h vitals and pain scores","last 48 h intake and  output","last 24 h labs and trends","last 24 h imaging results"}.  This care required {MDM levels:26883} level of medical decision making.   [facility] 02/11/2023, 3:32 PM Please see Amion for pager number during day hours 7:00am-4:30pm

## 2023-02-11 NOTE — ED Notes (Signed)
Report given to the next RN... 

## 2023-02-11 NOTE — ED Provider Notes (Signed)
[facility] [facility] AT [facility] Provider Note   CSN: [id number] Arrival date & time: 02/11/23  4098     History  Chief Complaint  Patient presents with   Hip Pain    Shoulder pain     Sara Reid is a 37 y.o. female.  Patient with no significant past medical history presents to the emergency department today for reevaluation for 2 problems: Swelling and pain in the left shoulder and lateral chest as well as pain in the left hip and leg.    Patient's main complaint is pain in the left lateral hip with radiation down the left leg.  She has a history of SI joint problems however pain is on the side of the hip as opposed to the SI area. Patient denies warning symptoms of back pain including: fecal incontinence, urinary retention or overflow incontinence, night sweats, waking from sleep with back pain, unexplained fevers or weight loss, h/o cancer, IVDU, recent trauma.  She has been having difficulty walking now because of sharp stabbing pain in the left hip with weightbearing.  She states that she does not feel particularly weak.  No numbness or tingling distally.  In previous ED visit she was prescribed prednisone which she has taken 2 days of and a muscle relaxer which did not help her sleep last night.  In addition, she has also been battling cellulitis which started as a small papule/pustule area on the anterior left shoulder.  This was treated with doxycycline with improvement however over the past couple of days she has had new more widely distributed red bumps over the left anterior shoulder as well as swelling just medial to the axilla over the lateral chest wall.  This area is tender.  Patient does report swimming in the sound that [facility] just prior to this infection beginning.         Home Medications Prior to Admission medications   Medication Sig Start Date End Date Taking? Authorizing Provider  doxycycline (VIBRAMYCIN) 100 MG  capsule Take 100 mg by mouth 2 (two) times daily. 01/25/23  Yes [provider]  lisdexamfetamine (VYVANSE) 50 MG capsule Take 50 mg by mouth daily. 01/21/23  Yes [provider]  predniSONE (DELTASONE) 10 MG tablet See admin instructions. Take by mouth daily. Take 6 tabs by mouth daily  for 2 days, then 5 tabs for 2 days, then 4 tabs for 2 days, then 3 tabs for 2 days, 2 tabs for 2 days, then 1 tab by mouth daily for 2 days 02/08/23  Yes [provider]  clindamycin (CLEOCIN) 300 MG capsule Take 1 capsule (300 mg total) by mouth 3 (three) times daily for 7 days. 02/08/23 02/15/23  Peter Garter, PA  cyclobenzaprine (FLEXERIL) 10 MG tablet Take 1 tablet (10 mg total) by mouth 2 (two) times daily as needed for muscle spasms. 02/08/23   Peter Garter, PA  ibuprofen (ADVIL,MOTRIN) 600 MG tablet Take 1 tablet (600 mg total) by mouth every 6 (six) hours. 01/28/18   [name], DO  lisdexamfetamine (VYVANSE) 50 MG capsule Take 1 capsule by mouth every morning. 06/06/22     predniSONE (STERAPRED UNI-PAK 21 TAB) 10 MG (21) TBPK tablet Take by mouth daily. Take 6 tabs by mouth daily  for 2 days, then 5 tabs for 2 days, then 4 tabs for 2 days, then 3 tabs for 2 days, 2 tabs for 2 days, then 1 tab by mouth daily for 2 days 02/08/23  Sherian Maroon A, PA  sertraline (ZOLOFT) 100 MG tablet TAKE 1 TABLET BY MOUTH EVERY DAY - office visit before next refill 11/13/17   Shelva Majestic, MD      Allergies    Amoxicillin-pot clavulanate, Ceclor [cefaclor], and Eryzole    Review of Systems   Review of Systems  Physical Exam Updated Vital Signs BP (!) 143/95   Pulse 85   Temp 98.2 F (36.8 C) (Oral)   Resp 20   LMP 01/24/2023   SpO2 98%  Physical Exam Vitals and nursing note reviewed.  Constitutional:      General: She is not in acute distress.    Appearance: She is well-developed.  HENT:     Head: Normocephalic and atraumatic.     Right Ear: External ear normal.     Left  Ear: External ear normal.     Nose: Nose normal.  Eyes:     Conjunctiva/sclera: Conjunctivae normal.  Cardiovascular:     Rate and Rhythm: Normal rate and regular rhythm.     Heart sounds: No murmur heard. Pulmonary:     Effort: Pulmonary effort is normal. No respiratory distress.     Breath sounds: No wheezing, rhonchi or rales.  Chest:       Comments: There is approximately 3 cm area of induration and tenderness in the area shown over the lateral chest wall just medial to the axilla on the left side. Abdominal:     Palpations: Abdomen is soft.     Tenderness: There is no abdominal tenderness. There is no guarding or rebound.  Musculoskeletal:        General: Normal range of motion.     Cervical back: Normal range of motion and neck supple.     Right lower leg: No edema.     Left lower leg: No edema.     Comments: No step-off noted with palpation of spine.   Skin:    General: Skin is warm and dry.     Findings: No rash.  Neurological:     General: No focal deficit present.     Mental Status: She is alert. Mental status is at baseline.     Sensory: No sensory deficit.     Motor: No weakness.     Comments: 5/5 strength in entire lower extremities bilaterally. No sensation deficit.   Psychiatric:        Mood and Affect: Mood normal.     ED Results / Procedures / Treatments   Labs (all labs ordered are listed, but only abnormal results are displayed) Labs Reviewed  CBC WITH DIFFERENTIAL/PLATELET  BASIC METABOLIC PANEL    EKG None  Radiology [place] CHEST SOFT TISSUE  Result Date: 02/11/2023 CLINICAL DATA:  Anterior left shoulder swelling, cellulitis EXAM: ULTRASOUND LEFT UPPER EXTREMITY LIMITED TECHNIQUE: Ultrasound examination of the upper extremity soft tissues was performed in the area of clinical concern. COMPARISON:  None Available. FINDINGS: Targeted ultrasound was performed of the soft tissues of the anterior left shoulder at site of patient's clinical concern. Large  complex fluid collection within the deep subcutaneous soft tissues at this location measuring 5.8 x 2.5 x 5.7 cm. Posterior through transmission. No internal vascularity. Increased echogenicity of the adjacent fat lobules with edema and hyperemia. IMPRESSION: Large complex fluid collection within the deep subcutaneous soft tissues of the anterior left shoulder measuring 5.8 x 2.5 x 5.7 cm. Appearance most suggestive of abscess with surrounding cellulitis. Electronically Signed   By: Duanne Guess D.O.  On: 02/11/2023 11:51   DG Lumbar Spine Complete  Result Date: 02/11/2023 CLINICAL DATA:  left hip and leg pain, no injury EXAM: LUMBAR SPINE - COMPLETE 4+ VIEW COMPARISON:  06/01/2012 FINDINGS: There is no evidence of lumbar spine fracture. Alignment is normal. Progressed disc height loss at L4-L5 and minimal disc height loss at L5-S1. The remaining intervertebral disc spaces are maintained. IMPRESSION: Progressed disc height loss at L4-L5 and minimal disc height loss at L5-S1. No acute findings. Electronically Signed   By: Duanne Guess D.O.   On: 02/11/2023 11:48   DG Hip Unilat W or Wo Pelvis 2-3 Views Left  Result Date: 02/11/2023 CLINICAL DATA:  One-week history of left hip and leg pain. No known injury. EXAM: DG HIP (WITH OR WITHOUT PELVIS) 3V LEFT COMPARISON:  None Available. FINDINGS: There is no evidence of hip fracture or dislocation. Mild lateral acetabular over coverage. IMPRESSION: 1. No acute fracture or dislocation. 2. Mild lateral acetabular over coverage, which can be seen in the setting of pincer-type femoral acetabular impingement. Electronically Signed   By: Agustin Cree M.D.   On: 02/11/2023 11:47    Procedures Procedures    Medications Ordered in ED Medications  oxyCODONE-acetaminophen (PERCOCET/ROXICET) 5-325 MG per tablet 1 tablet (1 tablet Oral Given 02/11/23 1058)    ED Course/ Medical Decision Making/ A&P    Patient seen and examined. History obtained directly from  patient.  Reviewed previous ED visit notes.  I reviewed lab workup performed at that time.  Labs/EKG: None ordered  Imaging: Ordered x-ray of the lumbar spine and left hip, ordered soft tissue ultrasound of the area of swelling over the chest wall  Medications/Fluids: Ordered: None ordered.   Most recent vital signs reviewed and are as follows: BP (!) 143/95   Pulse 85   Temp 98.2 F (36.8 C) (Oral)   Resp 20   LMP 01/24/2023   SpO2 98%   Initial impression: The seem to be two separate problems from what I can tell.  Patient does not have significant infectious symptoms such as fever vomiting to suggest a disseminated infection.  No red flags in regards to her lower back symptoms.  10:17 AM Online search regarding water contamination at [facility]:  The advisory is for an area in [employer] at the public access located at the corner of [address] and [address] in [place]. Test results of water samples indicate a running monthly average of 37 enterococci per 100 milliliters of water. This exceeds the state and federal standards of a running monthly average of 35 enterococci per 100 milliliters, based on five samples taken within a 30-day period.  12:13 PM Reassessment performed. Patient appears stable.  Discussed with and seen by Dr. Karene Fry.  Labs personally reviewed and interpreted including: Ordered CBC and BMP  Imaging personally visualized and interpreted including: X-ray of the lumbar spine and hip, agree lumbar disc disease suspected.  Ultrasound of the chest wall, agree abscess and fluid collection.  Reviewed pertinent lab work and imaging with patient at bedside. Questions answered.   Most current vital signs reviewed and are as follows: BP 116/72 (BP Location: Right Arm)   Pulse 89   Temp 98.2 F (36.8 C) (Oral)   Resp 16   LMP 01/24/2023   SpO2 100%   Plan: Will further define area with CT. abscess is large enough and in a location  where I do not feel that it could be opened well.  She is also  on her second course of antibiotics and will likely need IV antibiotics.  4:22 PM Reassessment performed. Patient appears stable..  She is requesting Toradol for pain.  This will be ordered.  Labs personally reviewed and interpreted including: CBC with normal white blood cell count of 9.3, normal hemoglobin at 13.2; BMP with glucose of 186 otherwise unremarkable.  Imaging personally visualized and interpreted including: CT imaging of the chest, unfortunately it appears that the area of swelling was superior to the most superior cuts from the CT imaging.  Reviewed pertinent lab work and imaging with patient at bedside. Questions answered.   Most current vital signs reviewed and are as follows: BP 122/81 (BP Location: Right Arm)   Pulse 74   Temp 98.5 F (36.9 C) (Oral)   Resp 18   Ht 5\' 10"  (1.778 m)   Wt 108.9 kg   LMP 01/24/2023   SpO2 99%   BMI 34.44 kg/m   Plan: Admit to hospital.   I spoke with general surgery PA, plan for them to consult on patient when she arrives in the hospital.  I spoke with Dr. Marland Mcalpine of [facility] hospitalist.  Agrees to admission.  He request that I go ahead and order blood cultures and a UDS.                                Medical Decision Making Amount and/or Complexity of Data Reviewed Labs: ordered. Radiology: ordered.  Risk Prescription drug management. Decision regarding hospitalization.   Patient with sizable soft tissue swelling, likely abscess, without signs of sepsis.  Symptoms are worsening despite appropriate treatment with outpatient oral antibiotics.  For this reason, patient will be admitted for IV antibiotics and surgical consultation.  Patient with left hip pain with radiation down into her leg.  This seems most likely consistent with lumbar radiculopathy.  She does have some degenerative disc disease on her lumbar spine film.  Hip x-ray is negative.  Exam is not  concerning for septic arthritis.  She is already on prednisone.  Low concern for infection in the spine given lack of focal low back pain, and suspect that this is most likely a separate problem at this point.        Final Clinical Impression(s) / ED Diagnoses Final diagnoses:  Soft tissue abscess  Cellulitis of left shoulder  Pain of left hip    Rx / DC Orders ED Discharge Orders     None         Renne Crigler, PA-C 02/11/23 1625    Ernie Avena, MD 02/11/23 2040

## 2023-02-11 NOTE — Assessment & Plan Note (Signed)
-  Large complex fluid collection within the deep subcutaneous soft tissues of the anterior left shoulder measuring 5.8 x 2.5 x 5.7 cm seen on CT chest soft tissue. Has recent ulcerated lesions on left triceps region likely source of entry. Has hx of remote MRSA soft tissue infection. Denies IV drug use. UDS and blood cultures pending -continue IV vancomycin  -General surgery consulted and will see -NPO after midnight

## 2023-02-11 NOTE — Progress Notes (Signed)
Pharmacy Antibiotic Note  Sara Reid is a 37 y.o. female admitted on 02/11/2023 with  abscess .  Pharmacy has been consulted for vancomycin dosing. Pt is afebrile and WBC is WNL. SCr is WNL.   Plan: Vancomycin 2g IV x 1 then 1250mg  IV Q12H F/u renal fxn, C&S, clinical status and peak/trough at SS     Temp (24hrs), Avg:98.4 F (36.9 C), Min:98.2 F (36.8 C), Max:98.5 F (36.9 C)  Recent Labs  Lab 02/08/23 1021 02/11/23 1207  WBC 12.8* 9.3  CREATININE 0.44 0.51    Estimated Creatinine Clearance: 128.7 mL/min (by C-G formula based on SCr of 0.51 mg/dL).    Allergies  Allergen Reactions   Amoxicillin-Pot Clavulanate Hives and Rash   Ceclor [Cefaclor] Hives and Rash   Eryzole Hives and Rash    Pediazole    Antimicrobials this admission: Vanc 8/7>>  Dose adjustments this admission: N/A  Microbiology results: Pending  Thank you for allowing pharmacy to be a part of this patient's care.  Latonga Ponder, Drake Leach 02/11/2023 3:14 PM

## 2023-02-11 NOTE — Assessment & Plan Note (Addendum)
-  radicular pain starts in lateral hip down to left leg. Has hx of sciatica. Likely nerve impingement.  -NSAIDS PRN -continue rapid steroid taper -K pad -advised outpatient PT follow up

## 2023-02-11 NOTE — H&P (Signed)
History and Physical    Patient: Sara Reid [id number]:[id number] DOB: [birth year] DOA: 02/11/2023 DOS: the patient was seen and examined on 02/11/2023 PCP: Soundra Pilon, FNP  Patient coming from:  [facility]  Chief Complaint:  Chief Complaint  Patient presents with   Hip Pain    Shoulder pain    HPI: Sara Reid is a 37 y.o. female with medical history significant of ADHD, depression who presents with left shoulder/lateral chest wall pain and left hip pain.   Around 7/19 she noticed increase growth of a nodule to left anterior chest/clavicular region and completed a course of doxycycline. Prior to that she was at the beach and noticed several ulcerated bug bites to her left arm near her shoulder. Nodule size improved but then later came back prompting her to go to ER on 8/4 and was started on Clindamycin. Denies any fever, nausea or vomiting. She reports remote hx of MRSA soft tissue infection. No IV drug use. Only use tobacco and marijuana.  About 3 days ago also had severe radiating shooting pain from left lateral hip down her legs. Has hx of chronic lower back pain with radiation to buttocks and posterior leg. Pain is worse with weight bearing and laying with legs flat on the bed. She was started on prednisone taper which help a lot on the first day but then worse again today prompting her to come to ED. Denies saddle anesthesia or urinary symptoms.   In the ED, She was afebrile, normotensive on room air. No leukocytosis or anemia. No significant electrolyte abnormalities on BMP.   CT chest with contrast with mild asymmetric thickening of the left pectoralis major muscle which is only partially imaged on this exam. There is fat stranding along the surface of the muscle. Ultrasound of the chest obtain to further assess revealed Large complex fluid collection within the deep subcutaneous soft tissues of the anterior left shoulder measuring 5.8 x 2.5 x 5.7 cm.  Appearance most suggestive of abscess with surrounding cellulitis.  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    Review of Systems: As mentioned in the history of present illness. All other systems reviewed and are negative. Past Medical History:  Diagnosis Date   ADHD    Anxiety    Depression    Kidney stone complicating pregnancy 02/16/2015   Neonatal jaundice    UTI (lower urinary tract infection)    Past Surgical History:  Procedure Laterality Date   BREAST BIOPSY     benign   Social History:  reports that she has been smoking cigarettes. She has never used smokeless tobacco. She reports current alcohol use. She reports current drug use. Drug: Marijuana.  Allergies  Allergen Reactions   Amoxicillin-Pot Clavulanate Hives and Rash   Ceclor [Cefaclor] Hives and Rash   Eryzole Hives and Rash    Pediazole    Family History  Problem Relation Age of Onset   Depression Mother        anxiety   Anxiety disorder Mother    Hypertension Mother    Hypertension Father    Diabetes Maternal Grandmother    Heart disease Paternal Grandfather     Prior to Admission medications   Medication Sig Start Date End Date Taking? Authorizing Provider  clindamycin (CLEOCIN) 300 MG capsule Take 1 capsule (300 mg total) by mouth 3 (three) times daily for 7 days. 02/08/23  02/15/23 Yes Sherian Maroon A, PA  cyclobenzaprine (FLEXERIL) 10 MG tablet Take 1 tablet (10 mg total) by mouth 2 (two) times daily as needed for muscle spasms. 02/08/23  Yes Sherian Maroon A, PA  lisdexamfetamine (VYVANSE) 50 MG capsule Take 1 capsule by mouth every morning. 06/06/22  Yes   predniSONE (STERAPRED UNI-PAK 21 TAB) 10 MG (21) TBPK tablet Take by mouth daily. Take 6 tabs by mouth daily  for 2 days, then 5 tabs for 2 days, then 4 tabs for 2 days, then 3 tabs for 2 days, 2 tabs for 2 days, then 1 tab by mouth daily for 2  days 02/08/23  Yes Sherian Maroon A, PA  sertraline (ZOLOFT) 100 MG tablet TAKE 1 TABLET BY MOUTH EVERY DAY - office visit before next refill 11/13/17  Yes Shelva Majestic, MD    Physical Exam: Vitals:   02/11/23 1606 02/11/23 1828 02/11/23 1936 02/11/23 2253  BP: 122/81  (!) 137/93 (!) 147/72  Pulse: 74  77 85  Resp: 18  16 17   Temp:  98.7 F (37.1 C) 98.3 F (36.8 C) 99.2 F (37.3 C)  TempSrc:  Oral Oral Oral  SpO2: 99%  99% 98%  Weight:      Height:       Constitutional: NAD, calm, comfortable, well appearing middle age female laying in bed Eyes: lids and conjunctivae normal ENMT: Mucous membranes are moist.  Neck: normal, supple,  Respiratory: clear to auscultation bilaterally, no wheezing, no crackles. Normal respiratory effort. No accessory muscle use.  Cardiovascular: Regular rate and rhythm, no murmurs / rubs / gallops. No extremity edema.  Abdomen: no tenderness Musculoskeletal: no clubbing / cyanosis. No joint deformity upper and lower extremities. Good ROM, no contractures. Normal muscle tone.  Spine is mid-line with no step off or deformities or lesions. Tenderness to palpation of left SI joint. Reproducible radicular pain with extension of left knee. Negative straight leg raise. No reproducible symptoms with left hip internal or external rotation.  Skin: deep firm nodule to left anterior chest adjacent to left axillary region beneath the clavicles. Multiple Small healing lesions to left triceps region.  Neurologic: CN 2-12 grossly intact. Sensation intact,  Strength 5/5 in all 4.  Psychiatric: Normal judgment and insight. Alert and oriented x 3. Normal mood.   Data Reviewed:  See HPI  Assessment and Plan: * Abscess -Large complex fluid collection within the deep subcutaneous soft tissues of the anterior left shoulder measuring 5.8 x 2.5 x 5.7 cm seen on CT chest soft tissue. Has recent ulcerated lesions on left triceps region likely source of entry. Has hx of remote  MRSA soft tissue infection. Denies IV drug use. UDS and blood cultures pending -continue IV vancomycin  -General surgery consulted and will see -NPO after midnight    Radiculopathy of leg -radicular pain starts in lateral hip down to left leg. Has hx of sciatica. Likely nerve impingement.  -NSAIDS PRN -continue rapid steroid taper -K pad -advised outpatient PT follow up   Obesity BMI of 34  Depression -Continue Zoloft      Advance Care Planning:   Code Status: Full Code   Consults: General surgery  Family Communication: none at bedside  Severity of Illness: The appropriate patient status for this patient is INPATIENT. Inpatient status is judged to be reasonable and necessary in order to provide the required intensity of service to ensure the patient's safety. The patient's presenting symptoms, physical exam findings, and initial radiographic and laboratory data  in the context of their chronic comorbidities is felt to place them at high risk for further clinical deterioration. Furthermore, it is not anticipated that the patient will be medically stable for discharge from the hospital within 2 midnights of admission.   * I certify that at the point of admission it is my clinical judgment that the patient will require inpatient hospital care spanning beyond 2 midnights from the point of admission due to high intensity of service, high risk for further deterioration and high frequency of surveillance required.*  Author: Anselm Jungling, DO 02/11/2023 11:18 PM  For on call review [email or web address].

## 2023-02-11 NOTE — Assessment & Plan Note (Signed)
BMI of 34 

## 2023-02-11 NOTE — ED Notes (Signed)
Called [facility] to transport patient to [facility] 3[facility] rm# [id number]

## 2023-02-11 NOTE — Plan of Care (Signed)
[facility]  Transferring EDP: Josh Emmit Alexanders, PA-C  Sara Reid (DOB 1986-03-24)  The patient is a 37 year old female with no real past medical history who presents with swelling and pain in the left shoulder and lateral chest area and was found to have a very large soft tissue abscess.  General surgery has been consulted for further evaluation and will see the patient upon arrival.  She denies any history of IV drug use and just came back from the beach.  States that she has been seen previously and recently was in the ED for this abscess and was given clindamycin however the abscess continued to worsen.  She also complains of some hip pain is worse with weightbearing.  Imaging done and showed a partially bulging disc.  She was prescribed prednisone which she has been taking for 2 days and muscle relaxer which has really has not helped.  She is hemodynamically stable and given her symptoms she was accepted to [facility] at [facility] and currently has been placed on IV antibiotics with IV vancomycin by the EDP.

## 2023-02-11 NOTE — Assessment & Plan Note (Signed)
Continue Zoloft 

## 2023-02-11 NOTE — ED Triage Notes (Signed)
Pt arrives pov, bib wheelchair, c/o intermittent LT groin pain radiating to LE. Difficulty bearing weight. Also reports LT shoulder pain, dx recently with cellulitis

## 2023-02-12 ENCOUNTER — Encounter (HOSPITAL_COMMUNITY): Payer: Self-pay | Admitting: Radiology

## 2023-02-12 ENCOUNTER — Inpatient Hospital Stay (HOSPITAL_COMMUNITY): Payer: No Typology Code available for payment source

## 2023-02-12 DIAGNOSIS — L0291 Cutaneous abscess, unspecified: Secondary | ICD-10-CM | POA: Diagnosis not present

## 2023-02-12 MED ORDER — ACETAMINOPHEN 325 MG PO TABS
650.0000 mg | ORAL_TABLET | Freq: Four times a day (QID) | ORAL | Status: DC | PRN
  Administered 2023-02-13: 650 mg via ORAL
  Filled 2023-02-12: qty 2

## 2023-02-12 MED ORDER — IBUPROFEN 400 MG PO TABS
600.0000 mg | ORAL_TABLET | Freq: Four times a day (QID) | ORAL | Status: DC
  Filled 2023-02-12: qty 1

## 2023-02-12 MED ORDER — TRAMADOL HCL 50 MG PO TABS
50.0000 mg | ORAL_TABLET | Freq: Four times a day (QID) | ORAL | Status: DC | PRN
  Administered 2023-02-12 – 2023-02-13 (×3): 50 mg via ORAL
  Filled 2023-02-12 (×3): qty 1

## 2023-02-12 MED ORDER — GADOBUTROL 1 MMOL/ML IV SOLN
10.0000 mL | Freq: Once | INTRAVENOUS | Status: AC | PRN
  Administered 2023-02-12: 10 mL via INTRAVENOUS

## 2023-02-12 MED ORDER — MORPHINE SULFATE (PF) 2 MG/ML IV SOLN
2.0000 mg | Freq: Once | INTRAVENOUS | Status: AC
  Administered 2023-02-12: 2 mg via INTRAVENOUS
  Filled 2023-02-12: qty 1

## 2023-02-12 MED ORDER — TRAMADOL HCL 50 MG PO TABS: 50.0000 mg | ORAL_TABLET | Freq: Two times a day (BID) | ORAL | Status: DC | PRN

## 2023-02-12 MED ORDER — KETOROLAC TROMETHAMINE 30 MG/ML IJ SOLN
30.0000 mg | Freq: Four times a day (QID) | INTRAMUSCULAR | Status: DC
  Administered 2023-02-12 – 2023-02-13 (×5): 30 mg via INTRAVENOUS
  Filled 2023-02-12 (×5): qty 1

## 2023-02-12 MED ORDER — CYCLOBENZAPRINE HCL 10 MG PO TABS
10.0000 mg | ORAL_TABLET | Freq: Three times a day (TID) | ORAL | Status: DC | PRN
  Administered 2023-02-12 – 2023-02-13 (×3): 10 mg via ORAL
  Filled 2023-02-12 (×3): qty 1

## 2023-02-12 MED ORDER — ONDANSETRON HCL 4 MG/2ML IJ SOLN: 4.0000 mg | Freq: Four times a day (QID) | INTRAMUSCULAR | Status: DC | PRN

## 2023-02-12 MED ORDER — OXYCODONE HCL 5 MG PO TABS
5.0000 mg | ORAL_TABLET | ORAL | Status: DC | PRN
  Administered 2023-02-12 – 2023-02-14 (×5): 5 mg via ORAL
  Filled 2023-02-12 (×5): qty 1

## 2023-02-12 NOTE — Progress Notes (Signed)
The patient is tearful and requesting additional pain medication. Messaged Dr. Gretel Acre.

## 2023-02-12 NOTE — Hospital Course (Addendum)
Brief hospital course: PMH of ADHD, depression presented with the complaints of left chest wall pain as well as left hip pain radiating to her left leg. Found to have a pectoralis muscle abscess.  General surgery consulted.  Underwent I&D.  Also suspected to have L5 radiculopathy or acetabular impingement.  MRI lumbar spine-  Large left subarticular disc extrusion at L4-L5 with superior migration to the pedicle level. Left lateral recess narrowing with displacement of the left L5 nerve root.  Assessment and Plan: Left chest wall abscess. General surgery consulted. Underwent irrigation and debridement with Penrose drain placement on 8/9.  No cultures sent. Continue with IV antibiotics.  L5 radiculopathy. Patient was initially on prednisone but due to acute infection I will discontinue this medication. Continue Toradol Consulted orthopedics Dr. August Saucer.  MRI lumbar spine actually confirms L5 radiculopathy. Appreciate Dr. Marigene Ehlers assistance as well as orthopedics.  Currently patient will be referred to Dr. Christell Constant.  Awaiting further recommendation.  Obesity Body mass index is 34.44 kg/m.  Placing the pt at higher risk of poor outcomes.  Depression. Continue Zoloft.

## 2023-02-12 NOTE — Progress Notes (Signed)
Consult received for femoral acetabular impingement left hip Patient currently admitted for abscess treatment Describes radicular pain in the L4/L5 distribution on the left MRI pending lumbar spine Plain radiographs not really compelling for femoral acetabular impingement Not much groin pain but radicular pain is present We will check to see what the MRI scan shows. If left-sided pathology is present inpatient or outpatient epidural steroid injection could be beneficial

## 2023-02-12 NOTE — Plan of Care (Signed)
  Problem: Education: Goal: Knowledge of General Education information will improve Description: Including pain rating scale, medication(s)/side effects and non-pharmacologic comfort measures Outcome: Progressing   Problem: Activity: Goal: Risk for activity intolerance will decrease Outcome: Progressing   Problem: Pain Managment: Goal: General experience of comfort will improve Outcome: Progressing   

## 2023-02-12 NOTE — Progress Notes (Signed)
[facility] Progress Note Patient: Sara Reid ZOX:[id number] DOB: 05-May-1986 DOA: 02/11/2023  DOS: the patient was seen and examined on 02/12/2023  Brief hospital course: PMH of ADHD, depression presented with the complaints of left chest wall pain as well as left hip pain radiating to her left leg. Found to have a pectoralis muscle abscess.  General surgery consulted.  Recommending I&D. N.p.o. after midnight. On IV antibiotics. Also suspected to have L5 radiculopathy or acetabular impingement.  MRI ordered. Assessment and Plan: Left chest wall abscess. General surgery consulted. Original plan was IND on 8/8. Due to power outage currently I&D scheduled for 8/9. Continue with IV antibiotics. Monitor for retention n.p.o. after midnight.  Left leg radiculopathy. Suspecting L5 radiculopathy. Patient was initially on prednisone but due to acute infection I will discontinue this medication. Continue Toradol Consulted orthopedics Dr. August Saucer.  Awaiting callback. Ordered MRI lumbar spine for further workup.  Obesity Body mass index is 34.44 kg/m.  Placing the pt at higher risk of poor outcomes.  Depression. Continue Zoloft.   Subjective: No nausea no vomiting no fever no chills.  Pain improving in the leg.  Pain improving in the shoulder as well.  Physical Exam: General: in Mild distress, No Rash Cardiovascular: S1 and S2 Present, No Murmur Respiratory: Good respiratory effort, Bilateral Air entry present. No Crackles, No wheezes Abdomen: Bowel Sound present, No tenderness Extremities: No edema, straight leg raising test is actually negative.  Patient has pain but only located in the hip area not in the back area. Pain improves with flexion of the spine and hip joint. No tingling or numbness although patient reports electric shocklike sensation. Neuro: Alert and oriented x3, no new focal deficit  Data Reviewed: I have Reviewed nursing notes, Vitals, and Lab  results. Since last encounter, pertinent lab results CBC and BMP   . I have ordered test including CBC and BMP  . I have discussed pt's care plan and test results with general surgery, call orthopedics  . I have ordered imaging MRI lumbar spine  .   Disposition: Status is: Inpatient Remains inpatient appropriate because: Awaiting further workup and treatment  SCDs Start: 02/11/23 2253   Family Communication: Husband at bedside Level of care: Med-Surg   Vitals:   02/11/23 2253 02/12/23 0400 02/12/23 0930 02/12/23 1354  BP: (!) 147/72 122/77 (!) 140/95 (!) 143/84  Pulse: 85 69 68 77  Resp: 17 16 20 18   Temp: 99.2 F (37.3 C) 97.7 F (36.5 C) 98.1 F (36.7 C) 98.6 F (37 C)  TempSrc: Oral Oral Oral Oral  SpO2: 98% 97% 98% 98%  Weight:      Height:         Author: Lynden Oxford, MD 02/12/2023 5:27 PM  Please look on [email or web address] to find out who is on call.

## 2023-02-13 ENCOUNTER — Encounter (HOSPITAL_COMMUNITY): Payer: Self-pay | Admitting: Internal Medicine

## 2023-02-13 ENCOUNTER — Encounter (HOSPITAL_COMMUNITY)
Admission: EM | Disposition: A | Discharge status: Home / Self Care | Payer: Self-pay | Source: Home / Self Care | Attending: Internal Medicine

## 2023-02-13 ENCOUNTER — Inpatient Hospital Stay (HOSPITAL_COMMUNITY): Payer: No Typology Code available for payment source | Admitting: Certified Registered"

## 2023-02-13 ENCOUNTER — Other Ambulatory Visit: Payer: Self-pay

## 2023-02-13 DIAGNOSIS — L0291 Cutaneous abscess, unspecified: Secondary | ICD-10-CM | POA: Diagnosis not present

## 2023-02-13 DIAGNOSIS — L02213 Cutaneous abscess of chest wall: Secondary | ICD-10-CM

## 2023-02-13 HISTORY — PX: IRRIGATION AND DEBRIDEMENT ABSCESS: SHX5252

## 2023-02-13 LAB — BASIC METABOLIC PANEL
Anion gap: 7 (ref 5–15)
BUN: 20 mg/dL (ref 6–20)
CO2: 22 mmol/L (ref 22–32)
Calcium: 8.5 mg/dL — ABNORMAL LOW (ref 8.9–10.3)
Chloride: 107 mmol/L (ref 98–111)
Creatinine, Ser: 0.67 mg/dL (ref 0.44–1.00)
GFR, Estimated: >60 mL/min (ref >60)
Glucose, Bld: 108 mg/dL — ABNORMAL HIGH (ref 70–99)
Potassium: 4 mmol/L (ref 3.5–5.1)
Sodium: 136 mmol/L (ref 135–145)

## 2023-02-13 LAB — PREGNANCY, URINE: Preg Test, Ur: NEGATIVE

## 2023-02-13 SURGERY — IRRIGATION AND DEBRIDEMENT ABSCESS
Anesthesia: General | Laterality: Left

## 2023-02-13 MED ORDER — BUPIVACAINE-EPINEPHRINE 0.25% -1:200000 IJ SOLN
INTRAMUSCULAR | Status: AC
  Filled 2023-02-13: qty 1

## 2023-02-13 MED ORDER — OXYCODONE HCL 5 MG PO TABS
ORAL_TABLET | ORAL | Status: AC
  Filled 2023-02-13: qty 1

## 2023-02-13 MED ORDER — DEXAMETHASONE SODIUM PHOSPHATE 4 MG/ML IJ SOLN
INTRAMUSCULAR | Status: DC | PRN
  Administered 2023-02-13: 10 mg via INTRAVENOUS

## 2023-02-13 MED ORDER — OXYCODONE HCL 5 MG/5ML PO SOLN: 5.0000 mg | Freq: Once | ORAL | Status: AC | PRN

## 2023-02-13 MED ORDER — 0.9 % SODIUM CHLORIDE (POUR BTL) OPTIME
TOPICAL | Status: DC | PRN
  Administered 2023-02-13: 1000 mL

## 2023-02-13 MED ORDER — ONDANSETRON HCL 4 MG/2ML IJ SOLN
INTRAMUSCULAR | Status: DC | PRN
  Administered 2023-02-13: 4 mg via INTRAVENOUS

## 2023-02-13 MED ORDER — MIDAZOLAM HCL 2 MG/2ML IJ SOLN
INTRAMUSCULAR | Status: DC | PRN
  Administered 2023-02-13: 2 mg via INTRAVENOUS

## 2023-02-13 MED ORDER — PROPOFOL 10 MG/ML IV BOLUS
INTRAVENOUS | Status: DC | PRN
Start: 2023-02-13 — End: 2023-02-13
  Administered 2023-02-13: 200 mg via INTRAVENOUS

## 2023-02-13 MED ORDER — KETOROLAC TROMETHAMINE 30 MG/ML IJ SOLN: 30.0000 mg | Freq: Four times a day (QID) | INTRAMUSCULAR | Status: DC | PRN

## 2023-02-13 MED ORDER — ACETAMINOPHEN 500 MG PO TABS
1000.0000 mg | ORAL_TABLET | Freq: Once | ORAL | Status: AC
  Administered 2023-02-13: 1000 mg via ORAL

## 2023-02-13 MED ORDER — LACTATED RINGERS IV SOLN: INTRAVENOUS | Status: DC | PRN

## 2023-02-13 MED ORDER — HYDROMORPHONE HCL 1 MG/ML IJ SOLN: 0.2500 mg | INTRAMUSCULAR | Status: DC | PRN

## 2023-02-13 MED ORDER — ACETAMINOPHEN 500 MG PO TABS
ORAL_TABLET | ORAL | Status: AC
  Filled 2023-02-13: qty 2

## 2023-02-13 MED ORDER — BUPIVACAINE-EPINEPHRINE 0.25% -1:200000 IJ SOLN
INTRAMUSCULAR | Status: DC | PRN
  Administered 2023-02-13: 15 mL

## 2023-02-13 MED ORDER — DEXAMETHASONE SODIUM PHOSPHATE 10 MG/ML IJ SOLN
INTRAMUSCULAR | Status: AC
  Filled 2023-02-13: qty 1

## 2023-02-13 MED ORDER — ONDANSETRON HCL 4 MG/2ML IJ SOLN
INTRAMUSCULAR | Status: AC
  Filled 2023-02-13: qty 2

## 2023-02-13 MED ORDER — LIDOCAINE HCL (PF) 2 % IJ SOLN
INTRAMUSCULAR | Status: AC
  Filled 2023-02-13: qty 5

## 2023-02-13 MED ORDER — OXYCODONE HCL 5 MG PO TABS
5.0000 mg | ORAL_TABLET | Freq: Once | ORAL | Status: AC | PRN
  Administered 2023-02-13: 5 mg via ORAL

## 2023-02-13 MED ORDER — MIDAZOLAM HCL 2 MG/2ML IJ SOLN
INTRAMUSCULAR | Status: AC
  Filled 2023-02-13: qty 2

## 2023-02-13 MED ORDER — FENTANYL CITRATE (PF) 100 MCG/2ML IJ SOLN
INTRAMUSCULAR | Status: DC | PRN
  Administered 2023-02-13 (×2): 50 ug via INTRAVENOUS

## 2023-02-13 MED ORDER — FENTANYL CITRATE (PF) 100 MCG/2ML IJ SOLN
INTRAMUSCULAR | Status: AC
  Filled 2023-02-13: qty 2

## 2023-02-13 MED ORDER — PROMETHAZINE HCL 25 MG/ML IJ SOLN: 6.2500 mg | INTRAMUSCULAR | Status: DC | PRN

## 2023-02-13 MED ORDER — AMISULPRIDE (ANTIEMETIC) 5 MG/2ML IV SOLN: 10.0000 mg | Freq: Once | INTRAVENOUS | Status: DC | PRN

## 2023-02-13 MED ORDER — CLINDAMYCIN HCL 300 MG PO CAPS
300.0000 mg | ORAL_CAPSULE | Freq: Three times a day (TID) | ORAL | Status: DC
  Administered 2023-02-13 – 2023-02-14 (×3): 300 mg via ORAL
  Filled 2023-02-13 (×4): qty 1

## 2023-02-13 MED ORDER — LIDOCAINE 2% (20 MG/ML) 5 ML SYRINGE
INTRAMUSCULAR | Status: DC | PRN
  Administered 2023-02-13: 100 mg via INTRAVENOUS

## 2023-02-13 MED ORDER — MEPERIDINE HCL 50 MG/ML IJ SOLN: 6.2500 mg | INTRAMUSCULAR | Status: DC | PRN

## 2023-02-13 SURGICAL SUPPLY — 34 items
ADH SKN CLS APL DERMABOND .7 (GAUZE/BANDAGES/DRESSINGS)
BAG COUNTER SPONGE SURGICOUNT (BAG) IMPLANT
BAG SPNG CNTER NS LX DISP (BAG)
COVER SURGICAL LIGHT HANDLE (MISCELLANEOUS) ×1 IMPLANT
DERMABOND ADVANCED .7 DNX12 (GAUZE/BANDAGES/DRESSINGS) IMPLANT
DRAIN PENROSE 0.25X18 (DRAIN) IMPLANT
DRAPE LAPAROSCOPIC ABDOMINAL (DRAPES) IMPLANT
DRAPE LAPAROTOMY T 102X78X121 (DRAPES) IMPLANT
DRAPE LAPAROTOMY TRNSV 102X78 (DRAPES) IMPLANT
DRAPE SHEET LG 3/4 BI-LAMINATE (DRAPES) IMPLANT
ELECT REM PT RETURN 15FT ADLT (MISCELLANEOUS) ×1 IMPLANT
GAUZE PAD ABD 8X10 STRL (GAUZE/BANDAGES/DRESSINGS) IMPLANT
GAUZE SPONGE 4X4 12PLY STRL (GAUZE/BANDAGES/DRESSINGS) ×1 IMPLANT
GLOVE BIO SURGEON STRL SZ7 (GLOVE) ×3 IMPLANT
GLOVE BIO SURGEON STRL SZ7.5 (GLOVE) ×1 IMPLANT
GLOVE BIOGEL PI IND STRL 7.0 (GLOVE) ×1 IMPLANT
GLOVE BIOGEL PI IND STRL 7.5 (GLOVE) ×3 IMPLANT
GOWN STRL REUS W/ TWL XL LVL3 (GOWN DISPOSABLE) ×2 IMPLANT
GOWN STRL REUS W/TWL XL LVL3 (GOWN DISPOSABLE) ×2
KIT BASIN OR (CUSTOM PROCEDURE TRAY) ×1 IMPLANT
KIT TURNOVER KIT A (KITS) IMPLANT
MARKER SKIN DUAL TIP RULER LAB (MISCELLANEOUS) ×1 IMPLANT
NDL HYPO 25X1 1.5 SAFETY (NEEDLE) ×1 IMPLANT
NEEDLE HYPO 25X1 1.5 SAFETY (NEEDLE) ×1 IMPLANT
NS IRRIG 1000ML POUR BTL (IV SOLUTION) ×1 IMPLANT
PACK GENERAL/GYN (CUSTOM PROCEDURE TRAY) ×1 IMPLANT
SPIKE FLUID TRANSFER (MISCELLANEOUS) IMPLANT
SPONGE T-LAP 4X18 ~~LOC~~+RFID (SPONGE) IMPLANT
STAPLER VISISTAT 35W (STAPLE) IMPLANT
SUT ETHILON 2 0 PS N (SUTURE) IMPLANT
SUT MNCRL AB 4-0 PS2 18 (SUTURE) IMPLANT
SUT VIC AB 3-0 SH 18 (SUTURE) IMPLANT
SYR CONTROL 10ML LL (SYRINGE) ×1 IMPLANT
TOWEL OR 17X26 10 PK STRL BLUE (TOWEL DISPOSABLE) ×1 IMPLANT

## 2023-02-13 NOTE — Anesthesia Preprocedure Evaluation (Addendum)
Anesthesia Evaluation  Patient identified by MRN, date of birth, ID band Patient awake    Reviewed: Allergy & Precautions, H&P , NPO status , Patient's Chart, lab work & pertinent test results  Airway Mallampati: II   Neck ROM: full    Dental  (+) Dental Advisory Given, Teeth Intact   Pulmonary Current Smoker and Patient abstained from smoking.   breath sounds clear to auscultation       Cardiovascular negative cardio ROS  Rhythm:regular Rate:Normal     Neuro/Psych  PSYCHIATRIC DISORDERS Anxiety Depression     Neuromuscular disease    GI/Hepatic   Endo/Other    Renal/GU Renal disease     Musculoskeletal   Abdominal   Peds  Hematology  (+) Blood dyscrasia, anemia   Anesthesia Other Findings   Reproductive/Obstetrics negative OB ROS                             Anesthesia Physical Anesthesia Plan  ASA: 2  Anesthesia Plan: General   Post-op Pain Management: Tylenol PO (pre-op)*   Induction: Intravenous  PONV Risk Score and Plan: 3 and Treatment may vary due to age or medical condition, Ondansetron, Dexamethasone and Midazolam  Airway Management Planned: LMA  Additional Equipment:   Intra-op Plan:   Post-operative Plan: Extubation in OR  Informed Consent: I have reviewed the patients History and Physical, chart, labs and discussed the procedure including the risks, benefits and alternatives for the proposed anesthesia with the patient or authorized representative who has indicated his/her understanding and acceptance.     Dental advisory given  Plan Discussed with: CRNA  Anesthesia Plan Comments:         Anesthesia Quick Evaluation

## 2023-02-13 NOTE — Anesthesia Procedure Notes (Signed)
Procedure Name: LMA Insertion Date/Time: 02/13/2023 12:30 PM  Performed by: Vanessa Dixie, CRNAPre-anesthesia Checklist: Emergency Drugs available, Patient identified, Suction available and Patient being monitored Patient Re-evaluated:Patient Re-evaluated prior to induction Oxygen Delivery Method: Circle system utilized Preoxygenation: Pre-oxygenation with 100% oxygen Induction Type: IV induction Ventilation: Mask ventilation without difficulty LMA: LMA inserted LMA Size: 4.0 Number of attempts: 1 Placement Confirmation: positive ETCO2 and breath sounds checked- equal and bilateral Tube secured with: Tape Dental Injury: Teeth and Oropharynx as per pre-operative assessment

## 2023-02-13 NOTE — Plan of Care (Signed)
  Problem: Education: Goal: Knowledge of General Education information will improve Description: Including pain rating scale, medication(s)/side effects and non-pharmacologic comfort measures Outcome: Progressing   Problem: Health Behavior/Discharge Planning: Goal: Ability to manage health-related needs will improve Outcome: Adequate for Discharge   Problem: Clinical Measurements: Goal: Ability to maintain clinical measurements within normal limits will improve Outcome: Progressing Goal: Will remain free from infection Outcome: Progressing Goal: Diagnostic test results will improve Outcome: Progressing Goal: Respiratory complications will improve Outcome: Progressing Goal: Cardiovascular complication will be avoided Outcome: Progressing   Problem: Activity: Goal: Risk for activity intolerance will decrease Outcome: Completed/Met   Problem: Nutrition: Goal: Adequate nutrition will be maintained Outcome: Completed/Met   Problem: Coping: Goal: Level of anxiety will decrease Outcome: Progressing   Problem: Elimination: Goal: Will not experience complications related to bowel motility Outcome: Progressing Goal: Will not experience complications related to urinary retention Outcome: Completed/Met   Problem: Pain Managment: Goal: General experience of comfort will improve Outcome: Progressing   Problem: Safety: Goal: Ability to remain free from injury will improve Outcome: Progressing   Problem: Skin Integrity: Goal: Risk for impaired skin integrity will decrease Outcome: Progressing

## 2023-02-13 NOTE — Consult Note (Signed)
ORTHOPAEDIC CONSULTATION  REQUESTING PHYSICIAN: Rolly Salter, MD  Chief Complaint: "My left leg hurts"  HPI: Sara Reid is a 37 y.o. female who presents with left leg pain.  She states that pain has been present for about a week now.  She states that she was just standing in her kitchen and felt a strange sensation and increased pain in her anterolateral thigh.  She did not do any bending/lifting/twisting at the time according to her recollection.  She denies any history of prior hip or back surgery.  She has had prior back pain that she thinks is SI joint related.  She does describe some occasional groin pain but the vast majority of her pain is in the anterolateral thigh and she has some distribution in the anterior shin as well.  She has associated burning sensation in this distribution as well as a tingling sensation but she denies any numbness.  No loss of bowel or bladder control.  She denies any weakness that she has noticed with moving her leg but she does state that she really cannot put weight on this leg without the use of a walker or she feels like she just cannot trust it and the leg will give out.  She is having some back pain as well but most of the pain is in her leg.  No right-sided symptoms.  Past Medical History:  Diagnosis Date   ADHD    Anxiety    Depression    Kidney stone complicating pregnancy 02/16/2015   Neonatal jaundice    UTI (lower urinary tract infection)    Past Surgical History:  Procedure Laterality Date   BREAST BIOPSY     benign   Social History   Socioeconomic History   Marital status: Married    Spouse name: Not on file   Number of children: Not on file   Years of education: Not on file   Highest education level: Not on file  Occupational History   Occupation: part time Consulting civil engineer and Company secretary    Employer: [employer]  Tobacco Use   Smoking status: Every Day    Current packs/day: 0.00    Types: Cigarettes    Last  attempt to quit: 01/20/2018    Years since quitting: 5.0   Smokeless tobacco: Never  Substance and Sexual Activity   Alcohol use: Yes    Comment: occ   Drug use: Yes    Types: Marijuana   Sexual activity: Yes  Other Topics Concern   Not on file  Social History Narrative   Married. Pregnant with first child.       Works as Dietitian: knitting, gardening   Social Determinants of Corporate investment banker Strain: Not on file  Food Insecurity: No Food Insecurity (02/11/2023)   Hunger Vital Sign    Worried About Running Out of Food in the Last Year: Never true    Ran Out of Food in the Last Year: Never true  Transportation Needs: No Transportation Needs (02/11/2023)   PRAPARE - Administrator, Civil Service (Medical): No    Lack of Transportation (Non-Medical): No  Physical Activity: Not on file  Stress: Not on file  Social Connections: Unknown (02/03/2023)   [facility]   Social Network    Social Network: Not on file   Family History  Problem Relation Age of Onset   Depression Mother  anxiety   Anxiety disorder Mother    Hypertension Mother    Hypertension Father    Diabetes Maternal Grandmother    Heart disease Paternal Grandfather    - negative except otherwise stated in the family history section Allergies  Allergen Reactions   Amoxicillin-Pot Clavulanate Hives and Rash   Ceclor [Cefaclor] Hives and Rash   Eryzole Hives and Rash    Pediazole   Prior to Admission medications   Medication Sig Start Date End Date Taking? Authorizing Provider  clindamycin (CLEOCIN) 300 MG capsule Take 1 capsule (300 mg total) by mouth 3 (three) times daily for 7 days. 02/08/23 02/15/23 Yes Sherian Maroon A, PA  cyclobenzaprine (FLEXERIL) 10 MG tablet Take 1 tablet (10 mg total) by mouth 2 (two) times daily as needed for muscle spasms. 02/08/23  Yes Sherian Maroon A, PA  lisdexamfetamine  (VYVANSE) 50 MG capsule Take 1 capsule by mouth every morning. 06/06/22  Yes   predniSONE (STERAPRED UNI-PAK 21 TAB) 10 MG (21) TBPK tablet Take by mouth daily. Take 6 tabs by mouth daily  for 2 days, then 5 tabs for 2 days, then 4 tabs for 2 days, then 3 tabs for 2 days, 2 tabs for 2 days, then 1 tab by mouth daily for 2 days 02/08/23  Yes Sherian Maroon A, PA  sertraline (ZOLOFT) 100 MG tablet TAKE 1 TABLET BY MOUTH EVERY DAY - office visit before next refill 11/13/17  Yes Shelva Majestic, MD   MR Lumbar Spine W Wo Contrast  Result Date: 02/12/2023 CLINICAL DATA:  Lumbar radiculopathy EXAM: MRI LUMBAR SPINE WITHOUT AND WITH CONTRAST TECHNIQUE: Multiplanar and multiecho pulse sequences of the lumbar spine were obtained without and with intravenous contrast. CONTRAST:  10mL GADAVIST GADOBUTROL 1 MMOL/ML IV SOLN COMPARISON:  None Available. FINDINGS: Segmentation:  Standard. Alignment:  Physiologic. Vertebrae:  No fracture, evidence of discitis, or bone lesion. Conus medullaris and cauda equina: Conus extends to the L2 level. Conus and cauda equina appear normal. Paraspinal and other soft tissues: Negative. Disc levels: L1-L2: Normal disc space and facet joints. No spinal canal stenosis. No neural foraminal stenosis. L2-L3: Normal disc space and facet joints. No spinal canal stenosis. No neural foraminal stenosis. L3-L4: Normal disc space and facet joints. No spinal canal stenosis. No neural foraminal stenosis. L4-L5: Large left subarticular disc extrusion with superior migration to the pedicle level. Left lateral recess narrowing with displacement of the left L5 nerve root. No central spinal canal stenosis. No neural foraminal stenosis. L5-S1: Left asymmetric disc bulge with mild left lateral recess narrowing. No central spinal canal stenosis. No neural foraminal stenosis. Visualized sacrum: Normal. IMPRESSION: 1. Large left subarticular disc extrusion at L4-L5 with superior migration to the pedicle level. Left  lateral recess narrowing with displacement of the left L5 nerve root. 2. Left asymmetric disc bulge at L5-S1 with mild left lateral recess narrowing. Electronically Signed   By: Deatra Robinson M.D.   On: 02/12/2023 22:21   CT Chest W Contrast  Result Date: 02/11/2023 CLINICAL DATA:  Soft tissue infection suspected, chest, xray done complex left-sided chest wall abscess EXAM: CT CHEST WITH CONTRAST TECHNIQUE: Multidetector CT imaging of the chest was performed during intravenous contrast administration. RADIATION DOSE REDUCTION: This exam was performed according to the departmental dose-optimization program which includes automated exposure control, adjustment of the mA and/or kV according to patient size and/or use of iterative reconstruction technique. CONTRAST:  75mL OMNIPAQUE IOHEXOL 300 MG/ML  SOLN COMPARISON:  None Available. FINDINGS: Cardiovascular:  Normal cardiac size. No pericardial effusion. No aortic aneurysm. Mediastinum/Nodes: Visualized thyroid gland appears grossly unremarkable. No solid / cystic mediastinal masses. The esophagus is nondistended precluding optimal assessment. No axillary, mediastinal or hilar lymphadenopathy by size criteria. Lungs/Pleura: The central tracheo-bronchial tree is patent. No mass or consolidation. No pleural effusion or pneumothorax. No suspicious lung nodules. Upper Abdomen: There are mild diffuse thickening of the left adrenal gland without discrete nodule. There are punctate calcifications. Findings may represent sequela of prior infection, inflammation or hemorrhage. Unremarkable right adrenal gland. Remaining visualized upper abdominal viscera within normal limits. Musculoskeletal: There is mild asymmetric thickening of the left pectoralis major muscle which is only partially imaged on this exam. There is fat stranding along the surface of the muscle. However, no discrete mass or abscess/collection seen. Consider examination of the left shoulder, if clinically  warranted. No suspicious osseous lesions. IMPRESSION: 1. Mild asymmetric thickening of the left pectoralis major muscle which is only partially imaged on this exam. There is fat stranding along the surface of the muscle. However, no discrete mass or abscess/collection seen. Consider examination of the left shoulder, if clinically warranted. 2. Mild diffuse thickening of the left adrenal gland without discrete nodule. There are punctate calcifications. Findings may represent sequela of prior infection, inflammation or hemorrhage. 3. Multiple other nonacute observations, as described above. Electronically Signed   By: Jules Schick M.D.   On: 02/11/2023 15:03   [place] CHEST SOFT TISSUE  Result Date: 02/11/2023 CLINICAL DATA:  Anterior left shoulder swelling, cellulitis EXAM: ULTRASOUND LEFT UPPER EXTREMITY LIMITED TECHNIQUE: Ultrasound examination of the upper extremity soft tissues was performed in the area of clinical concern. COMPARISON:  None Available. FINDINGS: Targeted ultrasound was performed of the soft tissues of the anterior left shoulder at site of patient's clinical concern. Large complex fluid collection within the deep subcutaneous soft tissues at this location measuring 5.8 x 2.5 x 5.7 cm. Posterior through transmission. No internal vascularity. Increased echogenicity of the adjacent fat lobules with edema and hyperemia. IMPRESSION: Large complex fluid collection within the deep subcutaneous soft tissues of the anterior left shoulder measuring 5.8 x 2.5 x 5.7 cm. Appearance most suggestive of abscess with surrounding cellulitis. Electronically Signed   By: Duanne Guess D.O.   On: 02/11/2023 11:51   DG Lumbar Spine Complete  Result Date: 02/11/2023 CLINICAL DATA:  left hip and leg pain, no injury EXAM: LUMBAR SPINE - COMPLETE 4+ VIEW COMPARISON:  06/01/2012 FINDINGS: There is no evidence of lumbar spine fracture. Alignment is normal. Progressed disc height loss at L4-L5 and minimal disc height  loss at L5-S1. The remaining intervertebral disc spaces are maintained. IMPRESSION: Progressed disc height loss at L4-L5 and minimal disc height loss at L5-S1. No acute findings. Electronically Signed   By: Duanne Guess D.O.   On: 02/11/2023 11:48   DG Hip Unilat W or Wo Pelvis 2-3 Views Left  Result Date: 02/11/2023 CLINICAL DATA:  One-week history of left hip and leg pain. No known injury. EXAM: DG HIP (WITH OR WITHOUT PELVIS) 3V LEFT COMPARISON:  None Available. FINDINGS: There is no evidence of hip fracture or dislocation. Mild lateral acetabular over coverage. IMPRESSION: 1. No acute fracture or dislocation. 2. Mild lateral acetabular over coverage, which can be seen in the setting of pincer-type femoral acetabular impingement. Electronically Signed   By: Agustin Cree M.D.   On: 02/11/2023 11:47   - pertinent xrays, CT, MRI studies were reviewed and independently interpreted  Positive ROS: All other systems  have been reviewed and were otherwise negative with the exception of those mentioned in the HPI and as above.  Physical Exam: General: Alert, no acute distress Psychiatric: Patient is competent for consent with normal mood and affect Lymphatic: No axillary or cervical lymphadenopathy Cardiovascular: No pedal edema Respiratory: No cyanosis, no use of accessory musculature GI: No organomegaly, abdomen is soft and non-tender    Images:  @ENCIMAGES @  Labs:  Lab Results  Component Value Date   HGBA1C 6.3 11/26/2016   HGBA1C 5.7 11/05/2010   LABURIC 7.2 (H) 02/28/2015   LABURIC 6.8 (H) 02/26/2015   LABURIC 5.0 02/18/2015   REPTSTATUS PENDING 02/11/2023   REPTSTATUS PENDING 02/11/2023   CULT  02/11/2023    NO GROWTH < 12 HOURS Performed at [facility], [address], [place], [place] [address]    CULT  02/11/2023    NO GROWTH < 12 HOURS Performed at [facility], [address] [place], [place] [address]     Lab Results  Component Value Date   ALBUMIN 4.4  11/26/2016   ALBUMIN 2.6 (L) 02/28/2015   ALBUMIN 2.4 (L) 02/26/2015   LABURIC 7.2 (H) 02/28/2015   LABURIC 6.8 (H) 02/26/2015   LABURIC 5.0 02/18/2015    Neurologic: Patient does not have protective sensation bilateral lower extremities.   MUSCULOSKELETAL:   Ortho exam demonstrates 2+ DP pulse of the bilateral lower extremities.  No calf tenderness.  Negative Homans' sign.  She has intact ankle dorsiflexion, EHL, plantarflexion, quad, hamstring, hip flexion strength.  All strength is rated 5/5 except for left sided dorsiflexion which is rated 5 -/5 and quadricep which is rated 5 -/5 but this 1 seems more secondary to pain.  She does have positive straight leg raise on the left and right reproducing primarily anterolateral thigh pain.  She has no decreased sensation in any distribution.  She has minimal pain with hip range of motion.  Negative Stinchfield sign bilaterally.  Able to perform FADIR test without discomfort significantly on the left.  Negative logroll of the left hip.  No knee effusion noted in either knee.  Assessment: Left leg radicular pain  Plan: Patient has MRI demonstrating large left subarticular disc with displacement of the L5 nerve root.  She has symptoms in this distribution.  No red flag symptoms based on her history today and she has no severe motor weakness.  Plan for evaluation by Dr. Christell Constant from spine surgery and his determination of optimal nonoperative versus operative treatment.  This does not seem to be related to the hip joint based on her history and exam today.  Thank you for the consult and the opportunity to see Sara Reid  [address] [place], [place] [facility] [phone] 8:20 AM

## 2023-02-13 NOTE — Plan of Care (Signed)
  Problem: Coping: Goal: Level of anxiety will decrease 02/13/2023 2247 by Kizzie Bane, RN Outcome: Progressing 02/13/2023 2247 by Kizzie Bane, RN Outcome: Progressing   Problem: Pain Managment: Goal: General experience of comfort will improve 02/13/2023 2247 by Kizzie Bane, RN Outcome: Progressing 02/13/2023 2247 by Kizzie Bane, RN Outcome: Progressing
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
## 2023-02-13 NOTE — Discharge Instructions (Signed)
Change gauze/tape daily. Change more frequently as needed if gauze becomes saturated with drainage.  You may shower normally with soap and water. Pat the area dry and place a clean dry gauze after your shower.  Do not soak the area in a bath, pool, hot tub, lake/ocean; only get it wet while showering.   Take medication as needed for pain, starting with tylenol (acetaminophen) and advil (ibuprofen). Only take narcotic medications as prescribed by your doctor.

## 2023-02-13 NOTE — Transfer of Care (Signed)
Immediate Anesthesia Transfer of Care Note  Patient: Sara Reid  Procedure(s) Performed: IRRIGATION AND DEBRIDEMENT ABSCESS (Left)  Patient Location: PACU  Anesthesia Type:General  Level of Consciousness: awake and patient cooperative  Airway & Oxygen Therapy: Patient Spontanous Breathing and Patient connected to face mask  Post-op Assessment: Report given to RN and Post -op Vital signs reviewed and stable  Post vital signs: Reviewed and stable  Last Vitals:  Vitals Value Taken Time  BP 134/94 02/13/23 1300  Temp    Pulse 81 02/13/23 1302  Resp 19 02/13/23 1302  SpO2 94 % 02/13/23 1302  Vitals shown include unfiled device data.  Last Pain:  Vitals:   02/13/23 1123  TempSrc: Oral  PainSc:       Patients Stated Pain Goal: 2 (02/13/23 0458)  Complications: No notable events documented.

## 2023-02-13 NOTE — TOC Initial Note (Signed)
Transition of Care [facility]) - Initial/Assessment Note   Patient Details  Name: Sara Reid MRN: [id number] Date of Birth: 1985/10/23  Transition of Care [facility]) CM/SW Contact:    Ewing Schlein, LCSW Phone Number: 02/13/2023, 11:34 AM  Clinical Narrative: TOC following for possible discharge needs.  Expected Discharge Plan:  (TBD) Barriers to Discharge: Continued Medical Work up  Expected Discharge Plan and Services In-house Referral: Clinical Social Work Living arrangements for the past 2 months: Single Family Home  Prior Living Arrangements/Services Living arrangements for the past 2 months: Single Family Home Lives with:: Spouse Patient language and need for interpreter reviewed:: Yes Need for Family Participation in Patient Care: No (Comment) Care giver support system in place?: Yes (comment) Criminal Activity/Legal Involvement Pertinent to Current Situation/Hospitalization: No - Comment as needed  Activities of Daily Living Home Assistive Devices/Equipment: None ADL Screening (condition at time of admission) Patient's cognitive ability adequate to safely complete daily activities?: Yes Is the patient deaf or have difficulty hearing?: No Does the patient have difficulty seeing, even when wearing glasses/contacts?: No Does the patient have difficulty concentrating, remembering, or making decisions?: No Patient able to express need for assistance with ADLs?: Yes Does the patient have difficulty dressing or bathing?: No Independently performs ADLs?: Yes (appropriate for developmental age) Does the patient have difficulty walking or climbing stairs?: Yes Weakness of Legs: Left Weakness of Arms/Hands: None  Emotional Assessment Orientation: : Oriented to Self, Oriented to Place, Oriented to  Time, Oriented to Situation Alcohol / Substance Use: Not Applicable Psych Involvement: No (comment)  Admission diagnosis:  Abscess [L02.91] Soft tissue abscess [L02.91] Pain of  left hip [M25.552] Cellulitis of left shoulder [L03.114] Patient Active Problem List   Diagnosis Date Noted   Abscess 02/11/2023   Radiculopathy of leg 02/11/2023   Labor and delivery, indication for care 01/27/2018   Obesity 11/26/2016   History of gestational diabetes 02/15/2015   Tobacco abuse 07/12/2014   Pruritic rash 04/08/2013   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   PCOS (polycystic ovarian syndrome) 10/02/2009   Depression 10/02/2009   Attention deficit disorder (ADD) in adult 10/02/2009   PCP:  Soundra Pilon, FNP Pharmacy:   [id number] - [place], [place] - [address] [place] [address] Phone: [phone] Fax: [phone]  Social Determinants of Health (SDOH) Social History: SDOH Screenings   Food Insecurity: No Food Insecurity (02/11/2023)  Housing: Low Risk  (02/11/2023)  Transportation Needs: No Transportation Needs (02/11/2023)  Utilities: Not At Risk (02/11/2023)  Social Connections: Unknown (02/03/2023)   Received from [facility]  Tobacco Use: High Risk (02/13/2023)   SDOH Interventions:    Readmission Risk Interventions     No data to display

## 2023-02-13 NOTE — Anesthesia Postprocedure Evaluation (Signed)
Anesthesia Post Note  Patient: Sara Reid  Procedure(s) Performed: IRRIGATION AND DEBRIDEMENT ABSCESS (Left)     Patient location during evaluation: PACU Anesthesia Type: General Level of consciousness: sedated and patient cooperative Pain management: pain level controlled Vital Signs Assessment: post-procedure vital signs reviewed and stable Respiratory status: spontaneous breathing Cardiovascular status: stable Anesthetic complications: no   No notable events documented.  Last Vitals:  Vitals:   02/13/23 1408 02/13/23 1616  BP: 123/76 (!) 144/79  Pulse: 72 92  Resp: 20 20  Temp:  36.7 C  SpO2: 99% 96%    Last Pain:  Vitals:   02/13/23 1616  TempSrc: Oral  PainSc:                  Lewie Loron

## 2023-02-13 NOTE — Op Note (Signed)
   Sara Reid 02/13/2023   Pre-op Diagnosis: LEFT CHEST WALL ABSCESS     Post-op Diagnosis: same  Procedure(s): INCISION AND DRAINAGE LEFT UPPER CHEST WALL ABSCESS (6 CM X 6 cm)  Surgeon(s): Abigail Miyamoto, MD  Anesthesia: LMA  Staff:  Physician Assistant: Adam Phenix, PA-C Scrub Person: Jennet Maduro, Chapman Moss  Estimated Blood Loss: Minimal               Procedure: The patient was brought to the operating identifies correct patient.  She is placed upon the operating table general anesthesia was induced.  Her left chest and shoulder were then prepped and draped in usual sterile fashion.  She had a 6 cm x 6 cm area of fluctuance in the upper chest with no erythema.  At the most lateral aspect of this mass I anesthetized skin with Marcaine and then made a longitudinal incision with a scalpel.  Using a hemostat I then opened up the cavity and drained a large amount of purulence.  We then irrigated the cavity with saline.  All purulent drainage appeared to be removed.  I then placed 1/4 inch Penrose drain into the cavity coming out of the small incision.  I sutured this Penrose in place with a 2-0 nylon suture.  Gauze and tape were then applied.  The patient tolerated the procedure well.  All the counts were correct at the end of the procedure.  The patient was then extubated in the operating room and taken in stable condition to the recovery room.          Abigail Miyamoto MD  Date: 02/13/2023  Time: 12:50 PM

## 2023-02-13 NOTE — Progress Notes (Signed)
[facility] Progress Note Patient: Sara Reid ZOX:[id number] DOB: 26-Feb-1986 DOA: 02/11/2023  DOS: the patient was seen and examined on 02/13/2023  Brief hospital course: PMH of ADHD, depression presented with the complaints of left chest wall pain as well as left hip pain radiating to her left leg. Found to have a pectoralis muscle abscess.  General surgery consulted.  Underwent I&D.  Also suspected to have L5 radiculopathy or acetabular impingement.  MRI lumbar spine-  Large left subarticular disc extrusion at L4-L5 with superior migration to the pedicle level. Left lateral recess narrowing with displacement of the left L5 nerve root.  Assessment and Plan: Left chest wall abscess. General surgery consulted. Underwent irrigation and debridement with Penrose drain placement on 8/9.  No cultures sent. Continue with IV antibiotics.  L5 radiculopathy. Patient was initially on prednisone but due to acute infection I will discontinue this medication. Continue Toradol Consulted orthopedics Dr. August Saucer.  MRI lumbar spine actually confirms L5 radiculopathy. Appreciate Dr. Marigene Ehlers assistance as well as orthopedics.  Currently patient will be referred to Dr. Christell Constant.  Awaiting further recommendation.  Obesity Body mass index is 34.44 kg/m.  Placing the pt at higher risk of poor outcomes.  Depression. Continue Zoloft.   Subjective: Pain well-controlled.  No nausea vomiting no fever no chills.  Back pain still present.  Although ability to walk appears to be improving with pain medication.  Physical Exam: General: in Mild distress, No Rash Cardiovascular: S1 and S2 Present, No Murmur Respiratory: Good respiratory effort, Bilateral Air entry present. No Crackles, No wheezes Abdomen: Bowel Sound present, No tenderness Extremities: No edema Neuro: Alert and oriented x3, no new focal deficit  Data Reviewed: I have Reviewed nursing notes, Vitals, and Lab results. Since last encounter,  pertinent lab results CBC and BMP   . I have ordered test including CBC and BMP  .   Disposition: Status is: Inpatient Remains inpatient appropriate because: Awaiting clearance from orthopedics and general surgery.  SCDs Start: 02/11/23 2253   Family Communication: Husband at bedside Level of care: Med-Surg   Vitals:   02/13/23 1332 02/13/23 1345 02/13/23 1408 02/13/23 1616  BP:  124/88 123/76 (!) 144/79  Pulse: 83 74 72 92  Resp: (!) 21 (!) 21 20 20   Temp:  98.3 F (36.8 C)  98.1 F (36.7 C)  TempSrc:    Oral  SpO2: 96% 99% 99% 96%  Weight:      Height:         Author: Lynden Oxford, MD 02/13/2023 6:34 PM  Please look on [email or web address] to find out who is on call.

## 2023-02-14 ENCOUNTER — Encounter (HOSPITAL_COMMUNITY): Payer: Self-pay | Admitting: Surgery

## 2023-02-14 DIAGNOSIS — L0291 Cutaneous abscess, unspecified: Secondary | ICD-10-CM | POA: Diagnosis not present

## 2023-02-14 MED ORDER — SACCHAROMYCES BOULARDII 250 MG PO CAPS: 250.0000 mg | ORAL_CAPSULE | Freq: Two times a day (BID) | ORAL | 0 refills | Status: AC

## 2023-02-14 MED ORDER — METHYLPREDNISOLONE 4 MG PO TBPK: ORAL_TABLET | ORAL | 0 refills | Status: AC

## 2023-02-14 MED ORDER — NAPROXEN 500 MG PO TBEC: 500.0000 mg | DELAYED_RELEASE_TABLET | Freq: Three times a day (TID) | ORAL | 0 refills | Status: AC

## 2023-02-14 MED ORDER — ACETAMINOPHEN 500 MG PO TABS: 500.0000 mg | ORAL_TABLET | Freq: Three times a day (TID) | ORAL | Status: DC

## 2023-02-14 MED ORDER — CLINDAMYCIN HCL 300 MG PO CAPS: 300.0000 mg | ORAL_CAPSULE | Freq: Four times a day (QID) | ORAL | 0 refills | Status: AC

## 2023-02-14 MED ORDER — OXYCODONE HCL 5 MG PO TABS: 5.0000 mg | ORAL_TABLET | Freq: Four times a day (QID) | ORAL | 0 refills | Status: DC | PRN

## 2023-02-14 MED ORDER — CYCLOBENZAPRINE HCL 10 MG PO TABS: 10.0000 mg | ORAL_TABLET | Freq: Three times a day (TID) | ORAL | 0 refills | Status: DC | PRN

## 2023-02-14 NOTE — Plan of Care (Signed)
  Problem: Education: Goal: Knowledge of General Education information will improve Description: Including pain rating scale, medication(s)/side effects and non-pharmacologic comfort measures Outcome: Progressing   Problem: Health Behavior/Discharge Planning: Goal: Ability to manage health-related needs will improve Outcome: Progressing   Problem: Clinical Measurements: Goal: Ability to maintain clinical measurements within normal limits will improve Outcome: Progressing   Problem: Coping: Goal: Level of anxiety will decrease Outcome: Progressing   Problem: Elimination: Goal: Will not experience complications related to bowel motility Outcome: Progressing   Problem: Pain Managment: Goal: General experience of comfort will improve Outcome: Progressing   Problem: Safety: Goal: Ability to remain free from injury will improve Outcome: Progressing   Problem: Skin Integrity: Goal: Risk for impaired skin integrity will decrease Outcome: Progressing   

## 2023-02-14 NOTE — Progress Notes (Signed)
Provided discharge education/instructions, all questions answered. Pt is not in any distress. Discharged home with belongings accompanied by her husband.

## 2023-02-14 NOTE — Progress Notes (Signed)
General Surgery  No acute complaints this morning. Pain controlled.   Vitals:   02/14/23 0158 02/14/23 0629  BP: 125/70 109/71  Pulse: 70 76  Resp: 18 18  Temp: 98.6 F (37 C) 99 F (37.2 C)  SpO2: 98% 98%   Alert and oriented, NAD Nonlabored respirations L upper chest wall wound is clean with no surrounding cellulitis. Penrose drain in place, serosanguinous drainage.  A/P: 60 female POD1 s/p incision and drainage of chest wall abscess. Dressing changed this morning, patient instructed on wound care at home. She already has a prescription for oral abx at home. Ok for discharge home today from a surgical standpoint. We will arrange outpatient follow up in the office.  Sophronia Simas, MD [facility] Surgery General, Hepatobiliary and Pancreatic Surgery 02/14/23 10:09 AM

## 2023-02-14 NOTE — Discharge Summary (Signed)
Physician Discharge Summary   Patient: Sara Reid MRN: [id number] DOB: 1985-11-30  Admit date:     02/11/2023  Discharge date: 02/14/2023  Discharge Physician: Lynden Oxford  PCP: Soundra Pilon, FNP  Recommendations at discharge: Follow-up with general surgery, PCP as well as orthopedic surgery as recommended.   Follow-up Information     Abigail Miyamoto, MD Follow up on 02/20/2023.   Specialty: General Surgery Why: appointment is at 10:00. please arrive by 9:30, For wound re-check Contact information: [address] [place] [address] [phone]         Soundra Pilon, FNP. Schedule an appointment as soon as possible for a visit in 2 week(s).   Specialty: Family Medicine Contact information: [address] [place] [address] [phone]         London Sheer, MD. Schedule an appointment as soon as possible for a visit in 3 week(s).   Specialty: Orthopedic Surgery Contact information: [address] [place] [address] [phone]                Discharge Diagnoses: Principal Problem:   Abscess Active Problems:   Depression   Obesity   Radiculopathy of leg  Brief hospital course: PMH of ADHD, depression presented with the complaints of left chest wall pain as well as left hip pain radiating to her left leg. Found to have a pectoralis muscle abscess.  General surgery consulted.  Underwent I&D.  Also suspected to have L5 radiculopathy or acetabular impingement.  MRI lumbar spine-  Large left subarticular disc extrusion at L4-L5 with superior migration to the pedicle level. Left lateral recess narrowing with displacement of the left L5 nerve root.  Assessment and Plan: Left chest wall abscess. General surgery consulted. Underwent irrigation and debridement with Penrose drain placement on 8/9.  No cultures sent. Treated with IV antibiotics.  Initially surgery recommended 300 mg 3 times daily of clindamycin but  given abscess will recommend higher dose.  300 milligram 4 times daily.  Add probiotics.  L5 radiculopathy. Patient was initially on prednisone but due to acute infection I will discontinue this medication. Continue Toradol Consulted orthopedics Dr. August Saucer.  MRI lumbar spine actually confirms L5 radiculopathy. Appreciate Dr. Marigene Ehlers assistance as well as orthopedics.  Currently patient will be referred to Dr. Christell Constant.  Per my discussion with Dr. Christell Constant, no inpatient intervention necessary for now.  Recommend oral Medrol Dosepak once able to take it from infection point of view.  Obesity Body mass index is 34.44 kg/m.  Placing the pt at higher risk of poor outcomes.  Depression. Continue Zoloft.  Pain control - [employer] Controlled Substance Reporting System database was reviewed. and patient was instructed, not to drive, operate heavy machinery, perform activities at heights, swimming or participation in water activities or provide baby-sitting services while on Pain, Sleep and Anxiety Medications; until their outpatient Physician has advised to do so again. Also recommended to not to take more than prescribed Pain, Sleep and Anxiety Medications.  Consultants:  General surgery Orthopedic surgery  Procedures performed:  Incision and drainage of the left chest wall wound with drain placement  DISCHARGE MEDICATION: Allergies as of 02/14/2023       Reactions   Amoxicillin-pot Clavulanate Hives, Rash   Ceclor [cefaclor] Hives, Rash   Eryzole Hives, Rash   Pediazole        Medication List     STOP taking these medications    predniSONE 10 MG (21) Tbpk tablet  Commonly known as: STERAPRED UNI-PAK 21 TAB       TAKE these medications    acetaminophen 500 MG tablet Commonly known as: TYLENOL Take 1 tablet (500 mg total) by mouth in the morning, at noon, and at bedtime for 7 days. Notes to patient: Last dose given 08/09 09:23pm   clindamycin 300 MG capsule Commonly known as:  Cleocin Take 1 capsule (300 mg total) by mouth 4 (four) times daily for 7 days. What changed: when to take this   cyclobenzaprine 10 MG tablet Commonly known as: FLEXERIL Take 1 tablet (10 mg total) by mouth 3 (three) times daily as needed for muscle spasms. What changed: when to take this Notes to patient: Last dose given 08/09 09:23pm   lisdexamfetamine 50 MG capsule Commonly known as: Vyvanse Take 1 capsule by mouth every morning. Notes to patient: Resume home regimen   methylPREDNISolone 4 MG Tbpk tablet Commonly known as: MEDROL DOSEPAK As directed on packet. Start taking on: February 21, 2023   naproxen 500 MG EC tablet Commonly known as: EC NAPROSYN Take 1 tablet (500 mg total) by mouth 3 (three) times daily with meals for 7 days.   oxyCODONE 5 MG immediate release tablet Commonly known as: Oxy IR/ROXICODONE Take 1 tablet (5 mg total) by mouth every 6 (six) hours as needed for severe pain. Notes to patient: Last dose given 08/10 06:35am   saccharomyces boulardii 250 MG capsule Commonly known as: Florastor Take 1 capsule (250 mg total) by mouth 2 (two) times daily for 14 days.   sertraline 100 MG tablet Commonly known as: ZOLOFT TAKE 1 TABLET BY MOUTH EVERY DAY - office visit before next refill       Disposition: Home Diet recommendation: Cardiac diet  Discharge Exam: Vitals:   02/13/23 2048 02/14/23 0158 02/14/23 0629 02/14/23 1018  BP: 135/84 125/70 109/71 123/80  Pulse: 79 70 76 86  Resp: 20 18 18 20   Temp: 98.5 F (36.9 C) 98.6 F (37 C) 99 F (37.2 C) 98.1 F (36.7 C)  TempSrc: Oral Oral Oral   SpO2: 98% 98% 98% 99%  Weight:      Height:       General: Appear in mild distress; no visible Abnormal Neck Mass Or lumps, Conjunctiva normal Cardiovascular: S1 and S2 Present, no Murmur, Respiratory: good respiratory effort, Bilateral Air entry present and CTA, no Crackles, no wheezes Abdomen: Bowel Sound present, Non tender  Extremities: no Pedal  edema Neurology: alert and oriented to time, place, and person  Filed Weights   02/11/23 1533 02/13/23 1130  Weight: 108.9 kg 108.9 kg   Condition at discharge: stable  The results of significant diagnostics from this hospitalization (including imaging, microbiology, ancillary and laboratory) are listed below for reference.   Imaging Studies: MR Lumbar Spine W Wo Contrast  Result Date: 02/12/2023 CLINICAL DATA:  Lumbar radiculopathy EXAM: MRI LUMBAR SPINE WITHOUT AND WITH CONTRAST TECHNIQUE: Multiplanar and multiecho pulse sequences of the lumbar spine were obtained without and with intravenous contrast. CONTRAST:  10mL GADAVIST GADOBUTROL 1 MMOL/ML IV SOLN COMPARISON:  None Available. FINDINGS: Segmentation:  Standard. Alignment:  Physiologic. Vertebrae:  No fracture, evidence of discitis, or bone lesion. Conus medullaris and cauda equina: Conus extends to the L2 level. Conus and cauda equina appear normal. Paraspinal and other soft tissues: Negative. Disc levels: L1-L2: Normal disc space and facet joints. No spinal canal stenosis. No neural foraminal stenosis. L2-L3: Normal disc space and facet joints. No spinal canal stenosis. No neural  foraminal stenosis. L3-L4: Normal disc space and facet joints. No spinal canal stenosis. No neural foraminal stenosis. L4-L5: Large left subarticular disc extrusion with superior migration to the pedicle level. Left lateral recess narrowing with displacement of the left L5 nerve root. No central spinal canal stenosis. No neural foraminal stenosis. L5-S1: Left asymmetric disc bulge with mild left lateral recess narrowing. No central spinal canal stenosis. No neural foraminal stenosis. Visualized sacrum: Normal. IMPRESSION: 1. Large left subarticular disc extrusion at L4-L5 with superior migration to the pedicle level. Left lateral recess narrowing with displacement of the left L5 nerve root. 2. Left asymmetric disc bulge at L5-S1 with mild left lateral recess narrowing.  Electronically Signed   By: Deatra Robinson M.D.   On: 02/12/2023 22:21   CT Chest W Contrast  Result Date: 02/11/2023 CLINICAL DATA:  Soft tissue infection suspected, chest, xray done complex left-sided chest wall abscess EXAM: CT CHEST WITH CONTRAST TECHNIQUE: Multidetector CT imaging of the chest was performed during intravenous contrast administration. RADIATION DOSE REDUCTION: This exam was performed according to the departmental dose-optimization program which includes automated exposure control, adjustment of the mA and/or kV according to patient size and/or use of iterative reconstruction technique. CONTRAST:  75mL OMNIPAQUE IOHEXOL 300 MG/ML  SOLN COMPARISON:  None Available. FINDINGS: Cardiovascular: Normal cardiac size. No pericardial effusion. No aortic aneurysm. Mediastinum/Nodes: Visualized thyroid gland appears grossly unremarkable. No solid / cystic mediastinal masses. The esophagus is nondistended precluding optimal assessment. No axillary, mediastinal or hilar lymphadenopathy by size criteria. Lungs/Pleura: The central tracheo-bronchial tree is patent. No mass or consolidation. No pleural effusion or pneumothorax. No suspicious lung nodules. Upper Abdomen: There are mild diffuse thickening of the left adrenal gland without discrete nodule. There are punctate calcifications. Findings may represent sequela of prior infection, inflammation or hemorrhage. Unremarkable right adrenal gland. Remaining visualized upper abdominal viscera within normal limits. Musculoskeletal: There is mild asymmetric thickening of the left pectoralis major muscle which is only partially imaged on this exam. There is fat stranding along the surface of the muscle. However, no discrete mass or abscess/collection seen. Consider examination of the left shoulder, if clinically warranted. No suspicious osseous lesions. IMPRESSION: 1. Mild asymmetric thickening of the left pectoralis major muscle which is only partially imaged on  this exam. There is fat stranding along the surface of the muscle. However, no discrete mass or abscess/collection seen. Consider examination of the left shoulder, if clinically warranted. 2. Mild diffuse thickening of the left adrenal gland without discrete nodule. There are punctate calcifications. Findings may represent sequela of prior infection, inflammation or hemorrhage. 3. Multiple other nonacute observations, as described above. Electronically Signed   By: Jules Schick M.D.   On: 02/11/2023 15:03   [place] CHEST SOFT TISSUE  Result Date: 02/11/2023 CLINICAL DATA:  Anterior left shoulder swelling, cellulitis EXAM: ULTRASOUND LEFT UPPER EXTREMITY LIMITED TECHNIQUE: Ultrasound examination of the upper extremity soft tissues was performed in the area of clinical concern. COMPARISON:  None Available. FINDINGS: Targeted ultrasound was performed of the soft tissues of the anterior left shoulder at site of patient's clinical concern. Large complex fluid collection within the deep subcutaneous soft tissues at this location measuring 5.8 x 2.5 x 5.7 cm. Posterior through transmission. No internal vascularity. Increased echogenicity of the adjacent fat lobules with edema and hyperemia. IMPRESSION: Large complex fluid collection within the deep subcutaneous soft tissues of the anterior left shoulder measuring 5.8 x 2.5 x 5.7 cm. Appearance most suggestive of abscess with surrounding cellulitis. Electronically Signed  By: Duanne Guess D.O.   On: 02/11/2023 11:51   DG Lumbar Spine Complete  Result Date: 02/11/2023 CLINICAL DATA:  left hip and leg pain, no injury EXAM: LUMBAR SPINE - COMPLETE 4+ VIEW COMPARISON:  06/01/2012 FINDINGS: There is no evidence of lumbar spine fracture. Alignment is normal. Progressed disc height loss at L4-L5 and minimal disc height loss at L5-S1. The remaining intervertebral disc spaces are maintained. IMPRESSION: Progressed disc height loss at L4-L5 and minimal disc height loss at  L5-S1. No acute findings. Electronically Signed   By: Duanne Guess D.O.   On: 02/11/2023 11:48   DG Hip Unilat W or Wo Pelvis 2-3 Views Left  Result Date: 02/11/2023 CLINICAL DATA:  One-week history of left hip and leg pain. No known injury. EXAM: DG HIP (WITH OR WITHOUT PELVIS) 3V LEFT COMPARISON:  None Available. FINDINGS: There is no evidence of hip fracture or dislocation. Mild lateral acetabular over coverage. IMPRESSION: 1. No acute fracture or dislocation. 2. Mild lateral acetabular over coverage, which can be seen in the setting of pincer-type femoral acetabular impingement. Electronically Signed   By: Agustin Cree M.D.   On: 02/11/2023 11:47    Microbiology: Results for orders placed or performed during the hospital encounter of 02/11/23  Blood culture (routine x 2)     Status: None (Preliminary result)   Collection Time: 02/11/23  4:54 PM   Specimen: BLOOD  Result Value Ref Range Status   Specimen Description   Final    BLOOD LEFT ANTECUBITAL Performed at [facility], [address], [place], [place] [address]    Special Requests   Final    BOTTLES DRAWN AEROBIC AND ANAEROBIC Blood Culture results may not be optimal due to an inadequate volume of blood received in culture bottles   Culture   Final    NO GROWTH 3 DAYS Performed at [facility], [address], [place], [place] [address]    Report Status PENDING  Incomplete  Blood culture (routine x 2)     Status: None (Preliminary result)   Collection Time: 02/11/23  4:54 PM   Specimen: BLOOD RIGHT FOREARM  Result Value Ref Range Status   Specimen Description BLOOD RIGHT FOREARM  Final   Special Requests   Final    BOTTLES DRAWN AEROBIC AND ANAEROBIC Blood Culture results may not be optimal due to an inadequate volume of blood received in culture bottles   Culture   Final    NO GROWTH 3 DAYS Performed at [facility], [address] [place], [place] [address]    Report Status PENDING   Incomplete   Labs: CBC: Recent Labs  Lab 02/08/23 1021 02/11/23 1207 02/12/23 0325  WBC 12.8* 9.3 10.1  NEUTROABS 11.0* 7.5  --   HGB 13.8 13.2 13.5  HCT 40.1 38.3 40.2  MCV 87.9 88.0 90.1  PLT 282 324 393   Basic Metabolic Panel: Recent Labs  Lab 02/08/23 1021 02/11/23 1207 02/13/23 1030  NA 135 136 136  K 4.2 3.7 4.0  CL 103 104 107  CO2 20* 23 22  GLUCOSE 135* 186* 108*  BUN 15 19 20   CREATININE 0.44 0.51 0.67  CALCIUM 8.8* 9.1 8.5*   Liver Function Tests: No results for input(s): "AST", "ALT", "ALKPHOS", "BILITOT", "PROT", "ALBUMIN" in the last 168 hours. CBG: No results for input(s): "GLUCAP" in the last 168 hours.  Discharge time spent: greater than 30 minutes.  Author: Lynden Oxford, MD  [facility] 02/14/2023

## 2023-02-15 ENCOUNTER — Encounter (HOSPITAL_BASED_OUTPATIENT_CLINIC_OR_DEPARTMENT_OTHER): Payer: Self-pay | Admitting: Emergency Medicine

## 2023-02-15 ENCOUNTER — Other Ambulatory Visit: Payer: Self-pay

## 2023-02-15 ENCOUNTER — Emergency Department (HOSPITAL_BASED_OUTPATIENT_CLINIC_OR_DEPARTMENT_OTHER)
Admission: EM | Admit: 2023-02-15 | Discharge: 2023-02-15 | Disposition: A | Discharge status: Home / Self Care | Payer: No Typology Code available for payment source | Attending: Emergency Medicine | Admitting: Emergency Medicine

## 2023-02-15 DIAGNOSIS — M79605 Pain in left leg: Secondary | ICD-10-CM | POA: Insufficient documentation

## 2023-02-15 DIAGNOSIS — M545 Low back pain, unspecified: Secondary | ICD-10-CM | POA: Diagnosis present

## 2023-02-15 DIAGNOSIS — M5126 Other intervertebral disc displacement, lumbar region: Secondary | ICD-10-CM | POA: Diagnosis not present

## 2023-02-15 MED ORDER — NAPROXEN 250 MG PO TABS
500.0000 mg | ORAL_TABLET | Freq: Once | ORAL | Status: AC
  Administered 2023-02-15: 500 mg via ORAL
  Filled 2023-02-15: qty 2

## 2023-02-15 MED ORDER — IBUPROFEN 600 MG PO TABS: 600.0000 mg | ORAL_TABLET | Freq: Four times a day (QID) | ORAL | 0 refills | Status: DC | PRN

## 2023-02-15 MED ORDER — ACETAMINOPHEN 500 MG PO TABS: 500.0000 mg | ORAL_TABLET | Freq: Four times a day (QID) | ORAL | 0 refills | Status: DC | PRN

## 2023-02-15 MED ORDER — LIDOCAINE 5 % EX PTCH
1.0000 | MEDICATED_PATCH | CUTANEOUS | Status: DC
  Administered 2023-02-15: 1 via TRANSDERMAL
  Filled 2023-02-15: qty 1

## 2023-02-15 MED ORDER — HYDROMORPHONE HCL 1 MG/ML IJ SOLN
1.0000 mg | Freq: Once | INTRAMUSCULAR | Status: AC
  Administered 2023-02-15: 1 mg via INTRAMUSCULAR
  Filled 2023-02-15: qty 1

## 2023-02-15 MED ORDER — DIAZEPAM 5 MG PO TABS: 5.0000 mg | ORAL_TABLET | Freq: Two times a day (BID) | ORAL | 0 refills | Status: DC

## 2023-02-15 MED ORDER — OMEPRAZOLE 20 MG PO CPDR: 20.0000 mg | DELAYED_RELEASE_CAPSULE | Freq: Every day | ORAL | 0 refills | Status: AC

## 2023-02-15 MED ORDER — DIAZEPAM 5 MG PO TABS
5.0000 mg | ORAL_TABLET | Freq: Once | ORAL | Status: AC
  Administered 2023-02-15: 5 mg via ORAL
  Filled 2023-02-15: qty 1

## 2023-02-15 MED ORDER — ACETAMINOPHEN 500 MG PO TABS
1000.0000 mg | ORAL_TABLET | Freq: Once | ORAL | Status: AC
  Administered 2023-02-15: 1000 mg via ORAL
  Filled 2023-02-15: qty 2

## 2023-02-15 NOTE — ED Provider Notes (Addendum)
[place] EMERGENCY DEPARTMENT AT [facility] Provider Note   CSN: [id number] Arrival date & time: 02/15/23  4098     History  Chief Complaint  Patient presents with  . Back Pain  . Leg Pain    Sara Reid is a 37 y.o. female.  HPI    SUBJECTIVE:  Sara Reid is a 37 y.o. female who complains of an injury causing low back pain 10 day(s) ago. The pain is positional with bending or lifting, with radiation down the L leg. Mechanism of injury: nothing specific. Symptoms have been constant and gradual since that time. Prior history of back problems: recurrent self limited episodes of low back pain in the past. There is no numbness in the legs, but there is some tingling present.   Patient was admitted to the hospital for abscess to her left shoulder, which was drained percutaneously.  While in the hospital, because of her severe back pain, MRI spine with and without contrast was ordered.  It revealed disc bulge and radiculopathy.   Home Medications Prior to Admission medications   Medication Sig Start Date End Date Taking? Authorizing Provider  acetaminophen (TYLENOL) 500 MG tablet Take 1 tablet (500 mg total) by mouth every 6 (six) hours as needed. 02/15/23  Yes Hillis Mcphatter, MD  diazepam (VALIUM) 5 MG tablet Take 1 tablet (5 mg total) by mouth 2 (two) times daily. 02/15/23  Yes Derwood Kaplan, MD  ibuprofen (ADVIL) 600 MG tablet Take 1 tablet (600 mg total) by mouth every 6 (six) hours as needed. 02/15/23  Yes Derwood Kaplan, MD  omeprazole (PRILOSEC) 20 MG capsule Take 1 capsule (20 mg total) by mouth daily. 02/15/23  Yes Derwood Kaplan, MD  clindamycin (CLEOCIN) 300 MG capsule Take 1 capsule (300 mg total) by mouth 4 (four) times daily for 7 days. 02/14/23 02/21/23  Rolly Salter, MD  lisdexamfetamine (VYVANSE) 50 MG capsule Take 1 capsule by mouth every morning. 06/06/22     methylPREDNISolone (MEDROL DOSEPAK) 4 MG TBPK tablet As directed on  packet. 02/21/23   Rolly Salter, MD  naproxen (EC NAPROSYN) 500 MG EC tablet Take 1 tablet (500 mg total) by mouth 3 (three) times daily with meals for 7 days. 02/14/23 02/21/23  Rolly Salter, MD  oxyCODONE (OXY IR/ROXICODONE) 5 MG immediate release tablet Take 1 tablet (5 mg total) by mouth every 6 (six) hours as needed for severe pain. 02/14/23   Rolly Salter, MD  saccharomyces boulardii (FLORASTOR) 250 MG capsule Take 1 capsule (250 mg total) by mouth 2 (two) times daily for 14 days. 02/14/23 02/28/23  Rolly Salter, MD  sertraline (ZOLOFT) 100 MG tablet TAKE 1 TABLET BY MOUTH EVERY DAY - office visit before next refill 11/13/17   Shelva Majestic, MD      Allergies    Amoxicillin-pot clavulanate, Ceclor [cefaclor], and Eryzole    Review of Systems   Review of Systems  Physical Exam Updated Vital Signs BP (!) 153/95   Pulse 84   Wt 108.9 kg   LMP 01/24/2023 Comment: Urine Pregnancy negative 02-13-2023  SpO2 96%   BMI 34.44 kg/m  Physical Exam Vitals and nursing note reviewed.  Constitutional:      Appearance: She is well-developed.     Comments: Patient appears to be in moderate pain  HENT:     Head: Atraumatic.  Cardiovascular:     Rate and Rhythm: Normal rate.  Pulmonary:     Effort: Pulmonary effort  is normal.  Musculoskeletal:     Cervical back: Neck supple.  Skin:    General: Skin is warm.  Neurological:     Mental Status: She is alert and oriented to person, place, and time.     ED Results / Procedures / Treatments   Labs (all labs ordered are listed, but only abnormal results are displayed) Labs Reviewed - No data to display  EKG None  Radiology No results found.  Procedures Procedures    Medications Ordered in ED Medications  lidocaine (LIDODERM) 5 % 1 patch (1 patch Transdermal Patch Applied 02/15/23 0752)  naproxen (NAPROSYN) tablet 500 mg (500 mg Oral Given 02/15/23 0758)  HYDROmorphone (DILAUDID) injection 1 mg (1 mg Intramuscular Given  02/15/23 0752)  diazepam (VALIUM) tablet 5 mg (5 mg Oral Given 02/15/23 0752)  acetaminophen (TYLENOL) tablet 1,000 mg (1,000 mg Oral Given 02/15/23 6213)    ED Course/ Medical Decision Making/ A&P Clinical Course as of 02/15/23 0858  Sun Feb 15, 2023  0865 Patient reassessed.  She is resting comfortably.  States that she is feeling a lot better.  We will advise that she discontinue Flexeril and take Valium instead.  We discussed stretching exercises, supportive adjunct medications.  She will follow-up with orthospine later this month. [AN]    Clinical Course User Index [AN] Derwood Kaplan, MD                                 Medical Decision Making Risk OTC drugs. Prescription drug management.   Young patient comes in with chief complaint of back pain differential diagnosis considered includes.  - DJD of the back - Spondylitises/ spondylosis - Sciatica - Spinal cord compression - Conus medullaris - Epidural hematoma - Epidural abscess - Lytic/pathologic fracture - Myelitis - Musculoskeletal pain   However, patient just had admission to the hospital and had MRI brain with and without contrast.  MRI reveals the impression as below:  IMPRESSION: 1. Large left subarticular disc extrusion at L4-L5 with superior migration to the pedicle level. Left lateral recess narrowing with displacement of the left L5 nerve root. 2. Left asymmetric disc bulge at L5-S1 with mild left lateral recess narrowing.  ASSESSMENT:  herniated disc likely at L4-5 Just discharged from the hospital - perhaps pain better controlled whilst in the hospital.  PLAN: For acute pain, rest, intermittent application of cold packs (later, may switch to heat, but do not sleep on heating pad), analgesics and muscle relaxants are recommended. Discussed longer term treatment plan of prn NSAID's and discussed a home back care exercise program with flexion exercise routine. Proper lifting with avoidance of heavy  lifting discussed. Consider Physical Therapy and XRay studies if not improving. Call or return to clinic prn if these symptoms worsen or fail to improve as anticipated. Final Clinical Impression(s) / ED Diagnoses Final diagnoses:  Lumbar herniated disc    Rx / DC Orders ED Discharge Orders          Ordered    ibuprofen (ADVIL) 600 MG tablet  Every 6 hours PRN        02/15/23 0755    acetaminophen (TYLENOL) 500 MG tablet  Every 6 hours PRN        02/15/23 0755    omeprazole (PRILOSEC) 20 MG capsule  Daily        02/15/23 0755    diazepam (VALIUM) 5 MG tablet  2 times daily  02/15/23 6578              Derwood Kaplan, MD 02/15/23 4696    Derwood Kaplan, MD 02/15/23 [phone]

## 2023-02-15 NOTE — Discharge Instructions (Addendum)
You are seen in the emergency room for worsening back pain.  As discussed, the staple in managing herniated disc medically is going to be resting your back, massaging the area of discomfort because of muscle spasms related to the herniated disc and being careful with mechanics and moving/lifting.  Additionally, alternate between ice and warm compresses.  Supportive medications have been added.  We recommend that you take ibuprofen and Tylenol every 6 hours.  Oxycodone can be taken in between only if the pain is severe.  Valium can be also used in between if needed.  Do not take Flexeril.  Do not mix alcohol with Valium or oxycodone.

## 2023-02-15 NOTE — ED Triage Notes (Signed)
Pt in with low back and L leg pain, hx of bulging disc disease. Pt recently d/c from hospital yesterday after L shoulder abscess I&D. Pt had MRI while in hospital for back pain, they found the bulging discs, and pt states her pain has worsened since home. Last Oxy at midnight. +numbness/tingling to LLE, denies any b&b incontinence.

## 2023-02-16 ENCOUNTER — Other Ambulatory Visit (INDEPENDENT_AMBULATORY_CARE_PROVIDER_SITE_OTHER): Payer: No Typology Code available for payment source

## 2023-02-16 ENCOUNTER — Ambulatory Visit (INDEPENDENT_AMBULATORY_CARE_PROVIDER_SITE_OTHER): Payer: No Typology Code available for payment source | Admitting: Orthopedic Surgery

## 2023-02-16 DIAGNOSIS — M5410 Radiculopathy, site unspecified: Secondary | ICD-10-CM | POA: Diagnosis not present

## 2023-02-16 NOTE — Progress Notes (Signed)
Orthopedic Spine Surgery Office Note  Assessment: Patient is a 37 y.o. female with low back pain that radiates into the left lower extremity along the anterolateral aspect of the thigh and leg. Has a left-sided disc herniation at L4/5 causing radiculopathy ~2 weeks since radicular leg pain onset   Plan: -Explained that initially conservative treatment is tried as a significant number of patients may experience relief with these treatment modalities. Discussed that the conservative treatments include:  -activity modification  -physical therapy  -over the counter pain medications  -medrol dosepak  -lumbar steroid injections -Patient has tried tylenol, ibuprofen, valium, medrol dosepak, oxycodone -Recommended diagnostic/therapeutic L4/5 transforaminal injection -Explained that most disc herniation do resorb on their own without surgical intervention so start with non-operative treatment -Told her that because she has [employer] she will need to do at least 6 weeks of PT before any elective spine surgery would be approved -Would need to be nicotine free prior to elective spine surgery -Patient should return to office in 4 weeks, x-rays at next visit: none   Patient expressed understanding of the plan and all questions were answered to the patient's satisfaction.   ___________________________________________________________________________   History:  Patient is a 37 y.o. female who presents today for lumbar spine. Patient has history of chronic mild low back pain. Within the last 2 weeks, she has noted acute onset of worsening low back pain with radiating left leg pain. There was no trauma or injury that preceded the onset of pain. Pain is felt radiating into the anterolateral left thigh and leg. It does not radiate into the foot. She has no pain radiating into the right lower extremity. She recently had an I&D to her left chest wall for an abscess. While in the hospital for that surgery, she got  an MRI of her lumbar spine. She was prescribed medications to control her radicular type pain at discharge. She says the current medications are keeping her pain under control.    Weakness: denies Symptoms of imbalance: denies Paresthesias and numbness: yes, decreased sensation over the anterolateral aspect of the left leg. No other numbness or paresthesias Bowel or bladder incontinence: denies Saddle anesthesia: denies  Treatments tried: tylenol, ibuprofen, valium, medrol dosepak, oxycodone  Review of systems: Denies fevers and chills, night sweats, unexplained weight loss, history of cancer, pain that wakes them at night  Past medical history: Depression ADHD  Allergies: augmentin, ceclor, eryzole  Past surgical history:  Breast biopsy Left chest wall abscess I&D  Social history: Reports use of nicotine product (smoking, vaping, patches, smokeless) Alcohol use: yes, approximately 8 drinks per week Reports marijuana use, denies other recreational drug use   Physical Exam:  BMI of 34.4  General: no acute distress, appears stated age Neurologic: alert, answering questions appropriately, following commands Respiratory: unlabored breathing on room air, symmetric chest rise Psychiatric: appropriate affect, normal cadence to speech   MSK (spine):  -Strength exam      Left  Right EHL    5/5  5/5 TA    5/5  5/5 GSC    5/5  5/5 Knee extension  5/5  5/5 Hip flexion   5/5  5/5  -Sensory exam    Sensation intact to light touch in L3-S1 nerve distributions of bilateral lower extremities  -Achilles DTR: 2/4 on the left, 2/4 on the right -Patellar tendon DTR: 2/4 on the left, 2/4 on the right  -Straight leg raise: negative bilaterally -Femoral nerve stretch test: negative bilaterally -Clonus: no beats bilaterally  -  Left hip exam: no pain through range of motion, negative stinchfield, negative faber -Right hip exam: no pain through range of motion, negative  stinchfield, negative faber  Imaging: XR of the lumbar spine from 02/16/2023 was independently reviewed and interpreted, showing disc height loss at L4/5 and L5/S1. No fracture or dislocation seen. No evidence of instability on flexion/extension views.   MRI of the lumbar spine from 02/12/2023 was independently reviewed and interpreted, showing foraminal stenosis on the left at L5/S1. Left paracentral disc herniation at L4/5 with cranial migration behind the body of L4. No other significant stenosis seen. DDD at L4/5 and L5/S1.    Patient name: Sara Reid Patient MRN: [id number] Date of visit: 02/16/23

## 2023-02-26 ENCOUNTER — Encounter: Payer: Self-pay | Admitting: Orthopedic Surgery

## 2023-02-26 MED ORDER — OXYCODONE HCL 5 MG PO TABS: 5.0000 mg | ORAL_TABLET | ORAL | 0 refills | Status: AC | PRN

## 2023-02-26 MED ORDER — DIAZEPAM 5 MG PO TABS: 5.0000 mg | ORAL_TABLET | Freq: Two times a day (BID) | ORAL | 0 refills | Status: AC

## 2023-02-26 MED ORDER — IBUPROFEN 600 MG PO TABS: 600.0000 mg | ORAL_TABLET | Freq: Four times a day (QID) | ORAL | 0 refills | Status: DC | PRN

## 2023-02-26 MED ORDER — ACETAMINOPHEN 500 MG PO TABS: 500.0000 mg | ORAL_TABLET | Freq: Four times a day (QID) | ORAL | 0 refills | Status: AC | PRN

## 2023-03-05 ENCOUNTER — Other Ambulatory Visit: Payer: Self-pay

## 2023-03-05 ENCOUNTER — Ambulatory Visit (INDEPENDENT_AMBULATORY_CARE_PROVIDER_SITE_OTHER): Payer: No Typology Code available for payment source | Admitting: Physical Medicine and Rehabilitation

## 2023-03-05 DIAGNOSIS — M5116 Intervertebral disc disorders with radiculopathy, lumbar region: Secondary | ICD-10-CM

## 2023-03-05 DIAGNOSIS — M5416 Radiculopathy, lumbar region: Secondary | ICD-10-CM | POA: Diagnosis not present

## 2023-03-05 MED ORDER — METHYLPREDNISOLONE ACETATE 80 MG/ML IJ SUSP
80.0000 mg | Freq: Once | INTRAMUSCULAR | Status: AC
  Administered 2023-03-05: 80 mg

## 2023-03-05 NOTE — Progress Notes (Signed)
Sara Reid - 37 y.o. female MRN [id number]  Date of birth: 09-26-1985  Office Visit Note: Visit Date: 03/05/2023 PCP: Soundra Pilon, FNP Referred by: London Sheer, MD  Subjective: Chief Complaint  Patient presents with   Lower Back - Pain   HPI:  Sara Reid is a 37 y.o. female who comes in today at the request of Dr. Willia Craze for planned Left L4-5 Lumbar Transforaminal epidural steroid injection with fluoroscopic guidance.  The patient has failed conservative care including home exercise, medications, time and activity modification.  This injection will be diagnostic and hopefully therapeutic.  Please see requesting physician notes for further details and justification.   ROS Otherwise per HPI.  Assessment & Plan: Visit Diagnoses:    ICD-10-CM   1. Lumbar radiculopathy  M54.16 XR C-ARM NO REPORT    Epidural Steroid injection    methylPREDNISolone acetate (DEPO-MEDROL) injection 80 mg    2. Radiculopathy due to lumbar intervertebral disc disorder  M51.16       Plan: No additional findings.   Meds & Orders:  Meds ordered this encounter  Medications   methylPREDNISolone acetate (DEPO-MEDROL) injection 80 mg    Orders Placed This Encounter  Procedures   XR C-ARM NO REPORT   Epidural Steroid injection    Follow-up: Return for Willia Craze.   Procedures: No procedures performed  Lumbosacral Transforaminal Epidural Steroid Injection - Sub-Pedicular Approach with Fluoroscopic Guidance  Patient: Sara Reid      Date of Birth: 1985/11/04 MRN: [id number] PCP: Soundra Pilon, FNP      Visit Date: 03/05/2023   Universal Protocol:    Date/Time: 03/05/2023  Consent Given By: the patient  Position: PRONE  Additional Comments: Vital signs were monitored before and after the procedure. Patient was prepped and draped in the usual sterile fashion. The correct patient, procedure, and site was verified.   Injection  Procedure Details:   Procedure diagnoses: Lumbar radiculopathy [M54.16]    Meds Administered:  Meds ordered this encounter  Medications   methylPREDNISolone acetate (DEPO-MEDROL) injection 80 mg    Laterality: Left  Location/Site: L4  Needle:5.0 in., 22 ga.  Short bevel or Quincke spinal needle  Needle Placement: Transforaminal  Findings:    -Comments: Excellent flow of contrast along the nerve, nerve root and into the epidural space.  Procedure Details: After squaring off the end-plates to get a true AP view, the C-arm was positioned so that an oblique view of the foramen as noted above was visualized. The target area is just inferior to the "nose of the scotty dog" or sub pedicular. The soft tissues overlying this structure were infiltrated with 2-3 ml. of 1% Lidocaine without Epinephrine.  The spinal needle was inserted toward the target using a "trajectory" view along the fluoroscope beam.  Under AP and lateral visualization, the needle was advanced so it did not puncture dura and was located close the 6 O'Clock position of the pedical in AP tracterory. Biplanar projections were used to confirm position. Aspiration was confirmed to be negative for CSF and/or blood. A 1-2 ml. volume of Isovue-250 was injected and flow of contrast was noted at each level. Radiographs were obtained for documentation purposes.   After attaining the desired flow of contrast documented above, a 0.5 to 1.0 ml test dose of 0.25% Marcaine was injected into each respective transforaminal space.  The patient was observed for 90 seconds post injection.  After no sensory deficits were reported, and normal lower  extremity motor function was noted,   the above injectate was administered so that equal amounts of the injectate were placed at each foramen (level) into the transforaminal epidural space.   Additional Comments:  No complications occurred Dressing: 2 x 2 sterile gauze and Band-Aid    Post-procedure  details: Patient was observed during the procedure. Post-procedure instructions were reviewed.  Patient left the clinic in stable condition.    Clinical History: MRI LUMBAR SPINE WITHOUT AND WITH CONTRAST   TECHNIQUE: Multiplanar and multiecho pulse sequences of the lumbar spine were obtained without and with intravenous contrast.   CONTRAST:  10mL GADAVIST GADOBUTROL 1 MMOL/ML IV SOLN   COMPARISON:  None Available.   FINDINGS: Segmentation:  Standard.   Alignment:  Physiologic.   Vertebrae:  No fracture, evidence of discitis, or bone lesion.   Conus medullaris and cauda equina: Conus extends to the L2 level. Conus and cauda equina appear normal.   Paraspinal and other soft tissues: Negative.   Disc levels:   L1-L2: Normal disc space and facet joints. No spinal canal stenosis. No neural foraminal stenosis.   L2-L3: Normal disc space and facet joints. No spinal canal stenosis. No neural foraminal stenosis.   L3-L4: Normal disc space and facet joints. No spinal canal stenosis. No neural foraminal stenosis.   L4-L5: Large left subarticular disc extrusion with superior migration to the pedicle level. Left lateral recess narrowing with displacement of the left L5 nerve root. No central spinal canal stenosis. No neural foraminal stenosis.   L5-S1: Left asymmetric disc bulge with mild left lateral recess narrowing. No central spinal canal stenosis. No neural foraminal stenosis.   Visualized sacrum: Normal.   IMPRESSION: 1. Large left subarticular disc extrusion at L4-L5 with superior migration to the pedicle level. Left lateral recess narrowing with displacement of the left L5 nerve root. 2. Left asymmetric disc bulge at L5-S1 with mild left lateral recess narrowing.     Electronically Signed   By: Deatra Robinson M.D.   On: 02/12/2023 22:21     Objective:  VS:  HT:    WT:   BMI:     BP:   HR: bpm  TEMP: ( )  RESP:  Physical Exam Vitals and nursing  note reviewed.  Constitutional:      General: She is not in acute distress.    Appearance: Normal appearance. She is not ill-appearing.  HENT:     Head: Normocephalic and atraumatic.     Right Ear: External ear normal.     Left Ear: External ear normal.  Eyes:     Extraocular Movements: Extraocular movements intact.  Cardiovascular:     Rate and Rhythm: Normal rate.     Pulses: Normal pulses.  Pulmonary:     Effort: Pulmonary effort is normal. No respiratory distress.  Abdominal:     General: There is no distension.     Palpations: Abdomen is soft.  Musculoskeletal:        General: Tenderness present.     Cervical back: Neck supple.     Right lower leg: No edema.     Left lower leg: No edema.     Comments: Patient has good distal strength with no pain over the greater trochanters.  No clonus or focal weakness. Positive left slump.  Skin:    Findings: No erythema, lesion or rash.  Neurological:     General: No focal deficit present.     Mental Status: She is alert and oriented to person,  place, and time.     Sensory: No sensory deficit.     Motor: No weakness or abnormal muscle tone.     Coordination: Coordination normal.  Psychiatric:        Mood and Affect: Mood normal.        Behavior: Behavior normal.      Imaging: No results found.

## 2023-03-05 NOTE — Progress Notes (Signed)
Functional Pain Scale - descriptive words and definitions  Mild (2)   Noticeable when not distracted/no impact on ADL's/sleep only slightly affected and able to   use both passive and active distraction for comfort. Mild range order  Average Pain 5   +Driver, -BT, -Dye Allergies.

## 2023-03-05 NOTE — Procedures (Signed)
Lumbosacral Transforaminal Epidural Steroid Injection - Sub-Pedicular Approach with Fluoroscopic Guidance  Patient: Sara Reid      Date of Birth: 1986-04-08 MRN: [id number] PCP: Soundra Pilon, FNP      Visit Date: 03/05/2023   Universal Protocol:    Date/Time: 03/05/2023  Consent Given By: the patient  Position: PRONE  Additional Comments: Vital signs were monitored before and after the procedure. Patient was prepped and draped in the usual sterile fashion. The correct patient, procedure, and site was verified.   Injection Procedure Details:   Procedure diagnoses: Lumbar radiculopathy [M54.16]    Meds Administered:  Meds ordered this encounter  Medications   methylPREDNISolone acetate (DEPO-MEDROL) injection 80 mg    Laterality: Left  Location/Site: L4  Needle:5.0 in., 22 ga.  Short bevel or Quincke spinal needle  Needle Placement: Transforaminal  Findings:    -Comments: Excellent flow of contrast along the nerve, nerve root and into the epidural space.  Procedure Details: After squaring off the end-plates to get a true AP view, the C-arm was positioned so that an oblique view of the foramen as noted above was visualized. The target area is just inferior to the "nose of the scotty dog" or sub pedicular. The soft tissues overlying this structure were infiltrated with 2-3 ml. of 1% Lidocaine without Epinephrine.  The spinal needle was inserted toward the target using a "trajectory" view along the fluoroscope beam.  Under AP and lateral visualization, the needle was advanced so it did not puncture dura and was located close the 6 O'Clock position of the pedical in AP tracterory. Biplanar projections were used to confirm position. Aspiration was confirmed to be negative for CSF and/or blood. A 1-2 ml. volume of Isovue-250 was injected and flow of contrast was noted at each level. Radiographs were obtained for documentation purposes.   After attaining the  desired flow of contrast documented above, a 0.5 to 1.0 ml test dose of 0.25% Marcaine was injected into each respective transforaminal space.  The patient was observed for 90 seconds post injection.  After no sensory deficits were reported, and normal lower extremity motor function was noted,   the above injectate was administered so that equal amounts of the injectate were placed at each foramen (level) into the transforaminal epidural space.   Additional Comments:  No complications occurred Dressing: 2 x 2 sterile gauze and Band-Aid    Post-procedure details: Patient was observed during the procedure. Post-procedure instructions were reviewed.  Patient left the clinic in stable condition.

## 2023-03-05 NOTE — Patient Instructions (Signed)

## 2023-03-15 ENCOUNTER — Other Ambulatory Visit: Payer: Self-pay | Admitting: Orthopedic Surgery

## 2023-03-16 ENCOUNTER — Ambulatory Visit (INDEPENDENT_AMBULATORY_CARE_PROVIDER_SITE_OTHER): Payer: No Typology Code available for payment source | Admitting: Orthopedic Surgery

## 2023-03-16 DIAGNOSIS — M5416 Radiculopathy, lumbar region: Secondary | ICD-10-CM

## 2023-03-16 NOTE — Progress Notes (Signed)
Orthopedic Spine Surgery Office Note   Assessment: Patient is a 37 y.o. female with low back pain that radiates into the left lower extremity along the anterolateral aspect of the thigh and leg. 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 Discussed that the conservative treatments include:             -activity modification             -physical therapy             -over the counter pain medications             -medrol dosepak             -lumbar steroid injections -Patient has tried tylenol, ibuprofen, valium, medrol dosepak, oxycodone, lumbar steroid injection -Told her that she can continue with ibuprofen 600mg  TID.  If pain does return, since she did get good relief with the injection, could try do another one -Patient has Monia Pouch so she would need to do at least 6 weeks of PT before any elective spine surgery per their policy -Would need to be nicotine free prior to elective spine surgery -Patient should return to office in 4 weeks, x-rays at next visit: none     Patient expressed understanding of the plan and all questions were answered to the patient's satisfaction.    ___________________________________________________________________________     History:   Patient is a 37 y.o. female who presents today for follow-up on her lumbar spine.  She is doing much better since she was last seen.  She still has some pain in the left leg along the anterolateral thigh and leg.  She is only taking ibuprofen at this point for the pain.  She has been able to stop taking the narcotics and Valium.  She does have some decrease sensation over the anterolateral leg and paresthesias in that area.  She has no other numbness or paresthesias.  She feels that she has been able to do a lot more  as her pain has decreased substantially.  No pain radiating into the right lower extremity.   Treatments tried: tylenol, ibuprofen, valium, medrol dosepak, oxycodone, lumbar steroid injection     Physical Exam:   General: no acute distress, appears stated age Neurologic: alert, answering questions appropriately, following commands Respiratory: unlabored breathing on room air, symmetric chest rise Psychiatric: appropriate affect, normal cadence to speech     MSK (spine):   -Strength exam                                                   Left                  Right EHL                              5/5                  5/5 TA  5/5                  5/5 GSC                             5/5                  5/5 Knee extension            5/5                  5/5 Hip flexion                    5/5                  5/5   -Sensory exam                           Sensation intact to light touch in L3-S1 nerve distributions of bilateral lower extremities   -Straight leg raise: negative bilaterally -Clonus: no beats bilaterally   Imaging: XR of the lumbar spine from 02/16/2023 was previously independently reviewed and interpreted, showing disc height loss at L4/5 and L5/S1. No fracture or dislocation seen. No evidence of instability on flexion/extension views.    MRI of the lumbar spine from 02/12/2023 was previously independently reviewed and interpreted, showing foraminal stenosis on the left at L5/S1. Left paracentral disc herniation at L4/5 with cranial migration behind the body of L4. No other significant stenosis seen. DDD at L4/5 and L5/S1.      Patient name: Sara Reid Patient MRN: [id number] Date of visit: 03/16/23

## 2023-04-27 ENCOUNTER — Ambulatory Visit: Payer: No Typology Code available for payment source | Admitting: Orthopedic Surgery

## 2023-04-27 DIAGNOSIS — M5416 Radiculopathy, lumbar region: Secondary | ICD-10-CM | POA: Diagnosis not present

## 2023-04-27 NOTE — Progress Notes (Signed)
Orthopedic Spine Surgery Office Note   Assessment: Patient is a 37 y.o. female with low back pain that radiates into the left lower extremity along the anterolateral aspect of the thigh and leg. 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  Referral provided to her today -Would need to be nicotine free prior to elective spine surgery -Patient should return to office on an as-needed basis     Patient expressed understanding of the plan and all questions were answered to the patient's satisfaction.    ___________________________________________________________________________     History:   Patient is a 37 y.o. female who presents today for follow-up on her lumbar spine.  Patient has been doing well since she was last seen.  She said she is having minimal pain radiating into the left lower extremity.  She is occasionally taking a ibuprofen to help with the pain.  She is not taking any other medications.  She still feels a little bit weaker on the left leg.  She says the sensation in her left leg has returned to normal.  Has not developed any new symptoms since last time she was seen.   Treatments tried: tylenol, ibuprofen, valium, medrol dosepak, oxycodone, lumbar steroid injection     Physical Exam:   General: no acute distress, appears stated age Neurologic: alert, answering questions appropriately, following commands Respiratory: unlabored breathing on room air, symmetric chest rise Psychiatric: appropriate affect, normal cadence to speech     MSK (spine):   -Strength exam                                                   Left                  Right EHL                              5/5                   5/5 TA                                 5/5                  5/5 GSC                             5/5                  5/5 Knee extension            5/5                  5/5 Hip flexion                    5/5                  5/5   -Sensory exam  Sensation intact to light touch in L3-S1 nerve distributions of bilateral lower extremities   -Straight leg raise: negative bilaterally -Clonus: no beats bilaterally   Imaging: XR of the lumbar spine from 02/16/2023 was previously independently reviewed and interpreted, showing disc height loss at L4/5 and L5/S1. No fracture or dislocation seen. No evidence of instability on flexion/extension views.    MRI of the lumbar spine from 02/12/2023 was previously independently reviewed and interpreted, showing foraminal stenosis on the left at L5/S1. Left paracentral disc herniation at L4/5 with cranial migration behind the body of L4. No other significant stenosis seen. DDD at L4/5 and L5/S1.      Patient name: Sara Reid Patient MRN: [id number] Date of visit: 04/27/23

## 2023-06-02 ENCOUNTER — Encounter: Payer: Self-pay | Admitting: Physical Therapy

## 2023-06-02 ENCOUNTER — Ambulatory Visit (INDEPENDENT_AMBULATORY_CARE_PROVIDER_SITE_OTHER): Payer: No Typology Code available for payment source | Admitting: Physical Therapy

## 2023-06-02 DIAGNOSIS — M5410 Radiculopathy, site unspecified: Secondary | ICD-10-CM | POA: Diagnosis not present

## 2023-06-02 DIAGNOSIS — M5459 Other low back pain: Secondary | ICD-10-CM

## 2023-06-02 DIAGNOSIS — M6281 Muscle weakness (generalized): Secondary | ICD-10-CM | POA: Diagnosis not present

## 2023-06-02 NOTE — Therapy (Signed)
OUTPATIENT PHYSICAL THERAPY THORACOLUMBAR EVALUATION   Patient Name: Sara Reid MRN: [id number] DOB:1986-05-20, 37 y.o., female Today's Date: 06/02/2023  END OF SESSION:  PT End of Session - 06/02/23 0803     Visit Number 1    Number of Visits 16    Date for PT Re-Evaluation 08/25/23    PT Start Time 0804    PT Stop Time 0842    PT Time Calculation (min) 38 min    Activity Tolerance Patient tolerated treatment well    Behavior During Therapy [facility] for tasks assessed/performed             Past Medical History:  Diagnosis Date   ADHD    Anxiety    Depression    Kidney stone complicating pregnancy 02/16/2015   Neonatal jaundice    UTI (lower urinary tract infection)    Past Surgical History:  Procedure Laterality Date   BREAST BIOPSY     benign   IRRIGATION AND DEBRIDEMENT ABSCESS Left 02/13/2023   Procedure: IRRIGATION AND DEBRIDEMENT ABSCESS;  Surgeon: Abigail Miyamoto, MD;  Location: [facility];  Service: General;  Laterality: Left;   Patient Active Problem List   Diagnosis Date Noted   Abscess 02/11/2023   Radiculopathy of leg 02/11/2023   Labor and delivery, indication for care 01/27/2018   Obesity 11/26/2016   History of gestational diabetes 02/15/2015   Tobacco abuse 07/12/2014   Pruritic rash 04/08/2013   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   PCOS (polycystic ovarian syndrome) 10/02/2009   Depression 10/02/2009   Attention deficit disorder (ADD) in adult 10/02/2009    PCP: Soundra Pilon, FNP  REFERRING PROVIDER: London Sheer, MD  REFERRING DIAG: London Sheer, MD  Rationale for Evaluation and Treatment: Rehabilitation  THERAPY DIAG:  Radiculopathy of leg  Other low back pain  Muscle weakness (generalized)  ONSET DATE: 3-4 months ago  SUBJECTIVE:                                                                                                                                                                                            SUBJECTIVE STATEMENT: Reports she has 2 slipped disc and she couldn't walk. States that she is feeling better. She is no longer having pain but is weak and still getting a lot of muscle twitches in her left leg. Not currently on any medications. Stairs are difficulty and she has to push herself up the stairs instead of just walking.   States she has a history of back soreness but she always just kind of dealt with the could no longer deal with symptoms a few months ago.  Overall she is doing much better but wants to work on lower extremity strength as well as reducing risk of repeat of recent episode of pain.       PERTINENT HISTORY:  History of low back pain  PAIN:  Are you having pain? Yes: NPRS scale: 3/10 Pain location: low back  Pain description: soreness  Aggravating factors: unsure Relieving factors: unsure  PRECAUTIONS: None  RED FLAGS: None   WEIGHT BEARING RESTRICTIONS: No  FALLS:  Has patient fallen in last 6 months? 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 Tends to sit on the couch with laptop at home. - mostly works at home.   PLOF: Independent  PATIENT GOALS: hiking, plays with kids.    OBJECTIVE:  Note: Objective measures were completed at Evaluation unless otherwise noted.  DIAGNOSTIC FINDINGS:  MRI 02/12/23 lx spine IMPRESSION: 1. Large left subarticular disc extrusion at L4-L5 with superior migration to the pedicle level. Left lateral recess narrowing with displacement of the left L5 nerve root. 2. Left asymmetric disc bulge at L5-S1 with mild left lateral recess narrowing.    PATIENT SURVEYS:  FOTO 10%  SCREENING FOR RED FLAGS: Bowel or bladder incontinence: No Spinal tumors: No Cauda equina syndrome:  No Compression fracture: No Abdominal aneurysm: No  COGNITION: Overall cognitive status: Within functional limits for tasks assessed     SENSATION: Not tested   POSTURE: rounded shoulders, forward head, posterior pelvic tilt, and flexed trunk   PALPATION: Increased resting tone and tenderness to palpation noted along left paraspinals and glutes.  No pain with lumbar spring testing  LUMBAR ROM:   AROM eval  Flexion WFL  Extension WFL  Right lateral flexion WFL*  Left lateral flexion WFL  Right rotation   Left rotation    (Blank rows = not tested) *Did not feel good    LE Measurements Lower Extremity Right EVAL Left EVAL   A/PROM MMT A/PROM MMT  Hip Flexion WFL 4+ WFL 4-  Hip Extension 10 4 10  3+  Hip Abduction      Hip Adduction      Hip Internal rotation [facility]  [facility]   Hip External rotation [facility]  WFL   Knee Flexion WFL 4 WFL 4-  Knee Extension WFL 4 WFL 4-  Ankle Dorsiflexion  4  4-  Ankle Plantarflexion      Ankle Inversion      Ankle Eversion       (Blank rows = not tested) * pain/off   LUMBAR SPECIAL TESTS:  Positive Ely's L,  Neg slump and SLR B Breathing assessment: Primarily anterior chest breather lumbar decompression noted with long inhale, minimal posterior lateral rib movement.    TODAY'S TREATMENT:  DATE:   06/02/2023  Therapeutic Exercise:  Aerobic: Supine: Prone: hamstring curls x5 5" holds B  Seated:  Standing: Neuromuscular Re-education: long exhale - 5 minutes, education on posture and position - use of foam pad under butt when sitting criss crossed. Manual Therapy: Therapeutic Activity: Self Care: Trigger Point Dry Needling:  Modalities:    PATIENT EDUCATION:  Education details: on current presentation, on HEP, on clinical outcomes score and POC, on anatomy and rationale behind breathing mechanics postural  changes Person educated: Patient Education method: Explanation, Demonstration, and Handouts Education comprehension: verbalized understanding   HOME EXERCISE PROGRAM: [id number]  ASSESSMENT:  CLINICAL IMPRESSION: Patient presents to physical therapy complaints of chronic low back pain and recent acute low back pain which has resolved patient with goal to improve lower extremity strength and reduce risk of repeat injury.  Patient with sedentary desk job and poor posture likely contributing to current presentation.  Session focused on education as well as postural adjustments to help alleviate symptoms and stress on lumbar spine.  Patient greatly benefit from skilled PT to improve overall function and quality of life.  OBJECTIVE IMPAIRMENTS: decreased activity tolerance, difficulty walking, decreased ROM, decreased strength, improper body mechanics, postural dysfunction, and pain.   ACTIVITY LIMITATIONS: lifting, squatting, sleeping, and locomotion level  PARTICIPATION LIMITATIONS: cleaning, community activity, and occupation  PERSONAL FACTORS: Fitness and Time since onset of injury/illness/exacerbation are also affecting patient's functional outcome.   REHAB POTENTIAL: Good  CLINICAL DECISION MAKING: Stable/uncomplicated  EVALUATION COMPLEXITY: Low   GOALS: Goals reviewed with patient? yes  SHORT TERM GOALS: Target date: 07/14/2023  Patient will be independent in self management strategies to improve quality of life and functional outcomes. Baseline: New Program Goal status: INITIAL  2.  Patient will report at least 50% improvement in overall symptoms and/or function to demonstrate improved functional mobility Baseline: 0% better Goal status: INITIAL  3.  Patient will report improved sitting posture at work with the use of supportive equipment as needed Baseline: Not currently Goal status: INITIAL      LONG TERM GOALS: Target date: 08/25/2023   Patient will report at  least 75% improvement in overall symptoms and/or function to demonstrate improved functional mobility Baseline: 0% better Goal status: INITIAL  2.  Patient will improve score on FOTO outcomes measure to projected score to demonstrate overall improved function and QOL Baseline: see above Goal status: INITIAL  3.  Patient will demonstrate good hip hinge motion to reduce stress on lumbar spine when bending and lifting. Baseline: Unable Goal status: INITIAL  4.  Patient will demonstrate least 4-5 MMT strength in bilateral lower extremities Baseline: See above Goal status: INITIAL   PLAN:  PT FREQUENCY: 1-2x/week for total of 16 visits over 12 weeks certification period  PT DURATION: 12 weeks  PLANNED INTERVENTIONS: 97110-Therapeutic exercises, 97530- Therapeutic activity, [id number]- Neuromuscular re-education, 97535- Self Care, [id number]- Manual therapy, [id number]- Gait training, [phone]- Orthotic Fit/training, [phone]- Canalith repositioning, [id number]- Aquatic Therapy, [id number]- Electrical stimulation (unattended), [phone]- Ionotophoresis 4mg /ml Dexamethasone, Patient/Family education, Balance training, Stair training, Taping, Dry Needling, Joint mobilization, Joint manipulation, Spinal manipulation, Spinal mobilization, Cryotherapy, and Moist heat   PLAN FOR NEXT SESSION: Breathing exercises to reduce intra-abdominal pressure/pressure management with functional movements.  Pelvic tilts progressing towards hip hinge motion, lower extremity strengthening and core strengthening, monitor for Valsalva maneuvers during functional tasks and correct   10:07 AM, 06/02/23 Tereasa Coop, DPT Physical Therapy with [facility]

## 2023-06-16 ENCOUNTER — Ambulatory Visit (INDEPENDENT_AMBULATORY_CARE_PROVIDER_SITE_OTHER): Payer: No Typology Code available for payment source | Admitting: Physical Therapy

## 2023-06-16 ENCOUNTER — Encounter: Payer: Self-pay | Admitting: Physical Therapy

## 2023-06-16 DIAGNOSIS — M6281 Muscle weakness (generalized): Secondary | ICD-10-CM | POA: Diagnosis not present

## 2023-06-16 DIAGNOSIS — M5459 Other low back pain: Secondary | ICD-10-CM

## 2023-06-16 DIAGNOSIS — M5410 Radiculopathy, site unspecified: Secondary | ICD-10-CM

## 2023-06-16 NOTE — Therapy (Addendum)
 OUTPATIENT PHYSICAL THERAPY THORACOLUMBAR TREATMENT   Patient Name: Kiare Hott MRN: [id number] DOB:1986/06/25, 37 y.o., female Today's Date: 06/16/2023  END OF SESSION:  PT End of Session - 06/16/23 1058     Visit Number 2    Number of Visits 16    Date for PT Re-Evaluation 08/25/23    PT Start Time 1107    PT Stop Time 1142    PT Time Calculation (min) 35 min    Activity Tolerance Patient tolerated treatment well    Behavior During Therapy [facility] for tasks assessed/performed             Past Medical History:  Diagnosis Date   ADHD    Anxiety    Depression    Kidney stone complicating pregnancy 02/16/2015   Neonatal jaundice    UTI (lower urinary tract infection)    Past Surgical History:  Procedure Laterality Date   BREAST BIOPSY     benign   IRRIGATION AND DEBRIDEMENT ABSCESS Left 02/13/2023   Procedure: IRRIGATION AND DEBRIDEMENT ABSCESS;  Surgeon: Oza Blumenthal, MD;  Location: [facility];  Service: General;  Laterality: Left;   Patient Active Problem List   Diagnosis Date Noted   Abscess 02/11/2023   Radiculopathy of leg 02/11/2023   Labor and delivery, indication for care 01/27/2018   Obesity 11/26/2016   History of gestational diabetes 02/15/2015   Tobacco abuse 07/12/2014   Pruritic rash 04/08/2013   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   PCOS (polycystic ovarian syndrome) 10/02/2009   Depression 10/02/2009   Attention deficit disorder (ADD) in adult 10/02/2009    PCP: Alejandro Hurt, FNP  REFERRING PROVIDER: Diedra Fowler, MD  REFERRING DIAG: Diedra Fowler, MD  Rationale for Evaluation and Treatment: Rehabilitation  THERAPY DIAG:  Other low back pain  Muscle weakness (generalized)  Radiculopathy of leg  ONSET DATE: 3-4 months ago  SUBJECTIVE:                                                                                                                                                                                            SUBJECTIVE STATEMENT: Pt with no new complaints. L thigh feels weak and with mild pain twinges at times.   Eval: Reports she has 2 slipped disc and she couldn't walk. States that she is feeling better. She is no longer having pain but is weak and still getting a lot of muscle twitches in her left leg. Not currently on any medications. Stairs are difficulty and she has to push herself up the stairs instead of just walking.   States she has a history of back soreness  but she always just kind of dealt with the could no longer deal with symptoms a few months ago.  Overall she is doing much better but wants to work on lower extremity strength as well as reducing risk of repeat of recent episode of pain.   PERTINENT HISTORY:  History of low back pain  PAIN:  Are you having pain? Yes: NPRS scale: 3/10 Pain location: low back  Pain description: soreness  Aggravating factors: unsure Relieving factors: unsure  PRECAUTIONS: None  RED FLAGS: None   WEIGHT BEARING RESTRICTIONS: No  FALLS:  Has patient fallen in last 6 months? 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 Tends to sit on the couch with laptop at home. - mostly works at home.   PLOF: Independent  PATIENT GOALS: hiking, plays with kids.    OBJECTIVE:  Note: Objective measures were completed at Evaluation unless otherwise noted.  DIAGNOSTIC FINDINGS:  MRI 02/12/23 lx spine IMPRESSION: 1. Large left subarticular disc extrusion at L4-L5 with superior migration to the pedicle level. Left lateral recess narrowing with displacement of the left L5 nerve root. 2. Left asymmetric disc bulge at L5-S1 with mild left lateral recess narrowing.    PATIENT SURVEYS:  FOTO 10%  SCREENING FOR RED  FLAGS: Bowel or bladder incontinence: No Spinal tumors: No Cauda equina syndrome: No Compression fracture: No Abdominal aneurysm: No  COGNITION: Overall cognitive status: Within functional limits for tasks assessed     SENSATION: Not tested   POSTURE: rounded shoulders, forward head, posterior pelvic tilt, and flexed trunk   PALPATION: Increased resting tone and tenderness to palpation noted along left paraspinals and glutes.  No pain with lumbar spring testing  LUMBAR ROM:   AROM eval  Flexion WFL  Extension WFL  Right lateral flexion WFL*  Left lateral flexion WFL  Right rotation   Left rotation    (Blank rows = not tested) *Did not feel good    LE Measurements Lower Extremity Right EVAL Left EVAL   A/PROM MMT A/PROM MMT  Hip Flexion WFL 4+ WFL 4-  Hip Extension 10 4 10  3+  Hip Abduction      Hip Adduction      Hip Internal rotation [facility]   Hip External rotation [facility]  WFL   Knee Flexion WFL 4 WFL 4-  Knee Extension WFL 4 WFL 4-  Ankle Dorsiflexion  4  4-  Ankle Plantarflexion      Ankle Inversion      Ankle Eversion       (Blank rows = not tested) * pain/off   LUMBAR SPECIAL TESTS:  Positive Ely's L,  Neg slump and SLR B Breathing assessment: Primarily anterior chest breather lumbar decompression noted with long inhale, minimal posterior lateral rib movement.    TODAY'S TREATMENT:  DATE:    06/16/2023 Therapeutic Exercise:  Aerobic: Supine: bridging 2 x 10;  Prone: prone press ups to hands 2 x 10 (education on form) ;  hip extension 2 x 10 bil; hamstring curls x5 5" holds B S/L: hip abd 2 x 10;   Seated: sit to stand with education on mechanics and TA during activity x 10; elevated mat table  Standing: Neuromuscular Re-education:  Manual Therapy: long leg distraction for lumbar on L;  Therapeutic Activity: Self  Care: Trigger Point Dry Needling:  Modalities:    PATIENT EDUCATION:  Education details: updated and reviewed HEP Person educated: Patient Education method: Explanation, Demonstration, and Handouts Education comprehension: verbalized understanding   HOME EXERCISE PROGRAM: [id number]  ASSESSMENT:  CLINICAL IMPRESSION: Pt with no increased pain during session. She has good tolerance for starting light strengthening. Will benefit from progressive strengthening as tolerated. Will benefit from education on TA contraction next visit. Updated HEP today.   Eval: Patient presents to physical therapy complaints of chronic low back pain and recent acute low back pain which has resolved patient with goal to improve lower extremity strength and reduce risk of repeat injury.  Patient with sedentary desk job and poor posture likely contributing to current presentation.  Session focused on education as well as postural adjustments to help alleviate symptoms and stress on lumbar spine.  Patient greatly benefit from skilled PT to improve overall function and quality of life.  OBJECTIVE IMPAIRMENTS: decreased activity tolerance, difficulty walking, decreased ROM, decreased strength, improper body mechanics, postural dysfunction, and pain.   ACTIVITY LIMITATIONS: lifting, squatting, sleeping, and locomotion level  PARTICIPATION LIMITATIONS: cleaning, community activity, and occupation  PERSONAL FACTORS: Fitness and Time since onset of injury/illness/exacerbation are also affecting patient's functional outcome.   REHAB POTENTIAL: Good  CLINICAL DECISION MAKING: Stable/uncomplicated  EVALUATION COMPLEXITY: Low   GOALS: Goals reviewed with patient? yes  SHORT TERM GOALS: Target date: 07/14/2023  Patient will be independent in self management strategies to improve quality of life and functional outcomes. Baseline: New Program Goal status: INITIAL  2.  Patient will report at least 50% improvement in  overall symptoms and/or function to demonstrate improved functional mobility Baseline: 0% better Goal status: INITIAL  3.  Patient will report improved sitting posture at work with the use of supportive equipment as needed Baseline: Not currently Goal status: INITIAL    LONG TERM GOALS: Target date: 08/25/2023   Patient will report at least 75% improvement in overall symptoms and/or function to demonstrate improved functional mobility Baseline: 0% better Goal status: INITIAL  2.  Patient will improve score on FOTO outcomes measure to projected score to demonstrate overall improved function and QOL Baseline: see above Goal status: INITIAL  3.  Patient will demonstrate good hip hinge motion to reduce stress on lumbar spine when bending and lifting. Baseline: Unable Goal status: INITIAL  4.  Patient will demonstrate least 4-5 MMT strength in bilateral lower extremities Baseline: See above Goal status: INITIAL   PLAN:  PT FREQUENCY: 1-2x/week for total of 16 visits over 12 weeks certification period  PT DURATION: 12 weeks  PLANNED INTERVENTIONS: 97110-Therapeutic exercises, 97530- Therapeutic activity, [id number]- Neuromuscular re-education, 97535- Self Care, 54098- Manual therapy, [phone]- Gait training, [phone]- Orthotic Fit/training, [phone]- Canalith repositioning, [id number]- Aquatic Therapy, [id number]- Electrical stimulation (unattended), [phone]- Ionotophoresis 4mg /ml Dexamethasone , Patient/Family education, Balance training, Stair training, Taping, Dry Needling, Joint mobilization, Joint manipulation, Spinal manipulation, Spinal mobilization, Cryotherapy, and Moist heat   PLAN FOR NEXT SESSION: Breathing exercises to reduce  intra-abdominal pressure/pressure management with functional movements.  Pelvic tilts progressing towards hip hinge motion, lower extremity strengthening and core strengthening, monitor for Valsalva maneuvers during functional tasks and correct   Terrilee Few, PT,  DPT 11:50 AM  06/16/23   PHYSICAL THERAPY DISCHARGE SUMMARY  Visits from Start of Care: 2   Plan: Patient agrees to discharge.  Patient goals were not met. Patient is being discharged due to meeting Pt did not return after visit 2.       Terrilee Few, PT, DPT 3:54 PM  11/19/23

## 2023-06-23 ENCOUNTER — Encounter: Payer: No Typology Code available for payment source | Admitting: Physical Therapy

## 2023-07-13 ENCOUNTER — Encounter: Payer: No Typology Code available for payment source | Admitting: Physical Therapy
